# Patient Record
Sex: Male | Born: 1985 | Hispanic: Refuse to answer
Health system: Midwestern US, Community
[De-identification: ages and names within clinical notes are randomized; demographics above are authoritative.]

## PROBLEM LIST (undated history)

## (undated) DIAGNOSIS — F1110 Opioid abuse, uncomplicated: Secondary | ICD-10-CM

## (undated) DIAGNOSIS — N200 Calculus of kidney: Secondary | ICD-10-CM

## (undated) HISTORY — PX: KIDNEY STONE SURGERY: SHX686

---
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
## 2006-04-04 ENCOUNTER — Emergency Department (HOSPITAL_COMMUNITY): Admission: EM | Admit: 2006-04-04 | Discharge: 2006-04-05 | Payer: Self-pay | Admitting: Obstetrics and Gynecology

## 2009-08-05 ENCOUNTER — Emergency Department (HOSPITAL_COMMUNITY): Admission: EM | Admit: 2009-08-05 | Discharge: 2009-08-05 | Payer: Self-pay | Admitting: Emergency Medicine

## 2009-10-26 ENCOUNTER — Emergency Department (HOSPITAL_COMMUNITY): Admission: EM | Admit: 2009-10-26 | Discharge: 2009-10-26 | Payer: Self-pay | Admitting: Emergency Medicine

## 2010-04-10 LAB — URINALYSIS, ROUTINE W REFLEX MICROSCOPIC
Bilirubin Urine: NEGATIVE
Ketones, ur: NEGATIVE mg/dL
Specific Gravity, Urine: 1.025 (ref 1.005–1.030)
pH: 6.5 (ref 5.0–8.0)

## 2010-04-10 LAB — URINE MICROSCOPIC-ADD ON
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
## 2011-02-06 ENCOUNTER — Emergency Department (HOSPITAL_COMMUNITY)
Admission: EM | Admit: 2011-02-06 | Discharge: 2011-02-06 | Disposition: A | Discharge status: Home / Self Care | Payer: Self-pay | Attending: Emergency Medicine | Admitting: Emergency Medicine

## 2011-02-06 ENCOUNTER — Encounter (HOSPITAL_COMMUNITY): Payer: Self-pay | Admitting: *Deleted

## 2011-02-06 DIAGNOSIS — R109 Unspecified abdominal pain: Secondary | ICD-10-CM | POA: Insufficient documentation

## 2011-02-06 DIAGNOSIS — N23 Unspecified renal colic: Secondary | ICD-10-CM | POA: Insufficient documentation

## 2011-02-06 HISTORY — DX: Calculus of kidney: N20.0

## 2011-02-06 LAB — POCT I-STAT, CHEM 8
BUN: 17 mg/dL (ref 6–23)
Calcium, Ion: 1.23 mmol/L (ref 1.12–1.32)
Chloride: 109 mEq/L (ref 96–112)
Creatinine, Ser: 0.9 mg/dL (ref 0.50–1.35)
Glucose, Bld: 76 mg/dL (ref 70–99)
HCT: 46 % (ref 39.0–52.0)
Hemoglobin: 15.6 g/dL (ref 13.0–17.0)
Potassium: 4.6 mEq/L (ref 3.5–5.1)
Sodium: 141 mEq/L (ref 135–145)
TCO2: 24 mmol/L (ref 0–100)

## 2011-02-06 LAB — URINALYSIS, ROUTINE W REFLEX MICROSCOPIC
Bilirubin Urine: NEGATIVE
Glucose, UA: NEGATIVE mg/dL
Ketones, ur: NEGATIVE mg/dL
Leukocytes, UA: NEGATIVE
Nitrite: NEGATIVE
Protein, ur: NEGATIVE mg/dL
Specific Gravity, Urine: 1.021 (ref 1.005–1.030)
Urobilinogen, UA: 0.2 mg/dL (ref 0.0–1.0)
pH: 5.5 (ref 5.0–8.0)

## 2011-02-06 MED ORDER — KETOROLAC TROMETHAMINE 15 MG/ML IJ SOLN
15.0000 mg | Freq: Once | INTRAMUSCULAR | Status: AC
  Administered 2011-02-06: 15 mg via INTRAVENOUS
  Filled 2011-02-06: qty 1

## 2011-02-06 MED ORDER — ONDANSETRON HCL 4 MG/2ML IJ SOLN
4.0000 mg | Freq: Once | INTRAMUSCULAR | Status: AC
  Administered 2011-02-06: 4 mg via INTRAVENOUS
  Filled 2011-02-06: qty 2

## 2011-02-06 MED ORDER — OXYCODONE-ACETAMINOPHEN 5-325 MG PO TABS
1.0000 | ORAL_TABLET | ORAL | Status: AC | PRN
Start: 2011-02-06 — End: 2011-02-16

## 2011-02-06 MED ORDER — SODIUM CHLORIDE 0.9 % IV BOLUS (SEPSIS)
1000.0000 mL | Freq: Once | INTRAVENOUS | Status: AC
  Administered 2011-02-06: 1000 mL via INTRAVENOUS

## 2011-02-06 MED ORDER — HYDROMORPHONE HCL PF 1 MG/ML IJ SOLN
1.0000 mg | Freq: Once | INTRAMUSCULAR | Status: AC
  Administered 2011-02-06: 1 mg via INTRAVENOUS
  Filled 2011-02-06: qty 1

## 2011-02-06 NOTE — ED Notes (Signed)
Dr. Juleen China speaking with pt.

## 2011-02-06 NOTE — ED Notes (Signed)
Pt states that he did not get the lithotripsy done the last time he had a kidney stone but could not afford it.  Pt states "unless there's a place who does it for free, im just gonna have to deal with it."  Pt drove to the ED but reports that his wife can come pick him up.

## 2011-02-06 NOTE — ED Notes (Signed)
Pt reports bila flank pain that started last night. Pt reports hx of kidney stones in the past.  Pt denies any n/v at this time.  Pt reports the last time he had kidney stone was 2 julys ago, states that he needed and lithotripsy but did not have it done d/t financial issues.

## 2011-02-06 NOTE — ED Provider Notes (Signed)
History    25yM with L flank pain. Triage note reviewed but pt specifically asked and only endorsed L flank pain.  Gradual onset last night. Pt works as [employer] certified accountant and just thought he strained his back. Pain increasing through out night and today. Constant with occasional sharper pain. Hx of kidney stones and says feels very similar to previous. No appreciable exacerbating or relieving factors. No vomiting. No fever or chills. No urinary complaints. No CP or SOB.    CSN: [id number]  Arrival date & time 02/06/11  1403   First MD Initiated Contact with Patient 02/06/11 1501      Chief Complaint  Patient presents with  . Flank Pain    (Consider location/radiation/quality/duration/timing/severity/associated sxs/prior treatment) HPI  Past Medical History  Diagnosis Date  . Kidney calculus     History reviewed. No pertinent past surgical history.  No family history on file.  History  Substance Use Topics  . Smoking status: Not on file  . Smokeless tobacco: Not on file  . Alcohol Use:       Review of Systems   Review of symptoms negative unless otherwise noted in HPI.   Allergies  Review of patient's allergies indicates no known allergies.  Home Medications  No current outpatient prescriptions on file.  BP 138/71  Pulse 96  Temp(Src) 97.9 F (36.6 C) (Oral)  Resp 20  SpO2 100%  Physical Exam  Nursing note and vitals reviewed. Constitutional: He appears well-developed and well-nourished.       Pt standing beside bed leaning over table when entered room. Uncomfortable appearing, but not toxic.  HENT:  Head: Normocephalic and atraumatic.  Eyes: Conjunctivae are normal. Pupils are equal, round, and reactive to light. Right eye exhibits no discharge. Left eye exhibits no discharge.  Neck: Normal range of motion. Neck supple.  Cardiovascular: Normal rate, regular rhythm and normal heart sounds.  Exam reveals no gallop and no friction rub.   No murmur  heard. Pulmonary/Chest: Effort normal and breath sounds normal. No respiratory distress.  Abdominal: Soft. He exhibits no distension and no mass. There is no tenderness. There is no rebound and no guarding.  Genitourinary:       L CVA tenderness  Musculoskeletal: He exhibits no edema and no tenderness.  Neurological: He is alert.  Skin: Skin is warm and dry.  Psychiatric: He has a normal mood and affect. His behavior is normal. Thought content normal.    ED Course  Procedures (including critical care time)  Labs Reviewed  URINALYSIS, ROUTINE W REFLEX MICROSCOPIC - Abnormal; Notable for the following:    Hgb urine dipstick LARGE (*)    All other components within normal limits  URINE MICROSCOPIC-ADD ON - Abnormal; Notable for the following:    Squamous Epithelial / LPF FEW (*)    Bacteria, UA FEW (*)    All other components within normal limits  POCT I-STAT, CHEM 8  I-STAT, CHEM 8  I-STAT, CHEM 8   No results found.  4:21 PM Pt reassessed. Sleeping when entered room. Pain much improved. No new complaints. Updated on UA. Blood tests still pending.   1. Renal colic on left side       MDM  25yM with L flank pain. Clinical suspicion for renal colic very high. Given hx of stones and similarity to previous, do not feel imaging necessary at this time. Large amount of blood on Ua which is consistent with this as well. Pt is voiding. Cr normal. Afebrile, HD stable  and doubt infected stone based on history and w/u. Plan symptomatic tx with expectant management. Urology referral.        Raeford Razor, MD 02/06/11 1754

## 2011-04-06 ENCOUNTER — Emergency Department (HOSPITAL_COMMUNITY)
Admission: EM | Admit: 2011-04-06 | Discharge: 2011-04-06 | Disposition: A | Discharge status: Home / Self Care | Payer: Self-pay | Attending: Emergency Medicine | Admitting: Emergency Medicine

## 2011-04-06 ENCOUNTER — Emergency Department (HOSPITAL_COMMUNITY): Payer: Self-pay

## 2011-04-06 ENCOUNTER — Encounter (HOSPITAL_COMMUNITY): Payer: Self-pay

## 2011-04-06 DIAGNOSIS — R319 Hematuria, unspecified: Secondary | ICD-10-CM | POA: Insufficient documentation

## 2011-04-06 DIAGNOSIS — R109 Unspecified abdominal pain: Secondary | ICD-10-CM | POA: Insufficient documentation

## 2011-04-06 DIAGNOSIS — R112 Nausea with vomiting, unspecified: Secondary | ICD-10-CM | POA: Insufficient documentation

## 2011-04-06 DIAGNOSIS — M549 Dorsalgia, unspecified: Secondary | ICD-10-CM | POA: Insufficient documentation

## 2011-04-06 DIAGNOSIS — N200 Calculus of kidney: Secondary | ICD-10-CM | POA: Insufficient documentation

## 2011-04-06 LAB — DIFFERENTIAL
Basophils Absolute: 0 10*3/uL (ref 0.0–0.1)
Eosinophils Relative: 1 % (ref 0–5)
Lymphocytes Relative: 25 % (ref 12–46)
Monocytes Absolute: 0.6 10*3/uL (ref 0.1–1.0)

## 2011-04-06 LAB — CBC
HCT: 44.2 % (ref 39.0–52.0)
MCH: 31.9 pg (ref 26.0–34.0)
MCHC: 36.2 g/dL — ABNORMAL HIGH (ref 30.0–36.0)
MCV: 88.2 fL (ref 78.0–100.0)
RDW: 13 % (ref 11.5–15.5)
WBC: 10.8 10*3/uL — ABNORMAL HIGH (ref 4.0–10.5)

## 2011-04-06 LAB — URINALYSIS, ROUTINE W REFLEX MICROSCOPIC
Glucose, UA: NEGATIVE mg/dL
Ketones, ur: NEGATIVE mg/dL
Protein, ur: NEGATIVE mg/dL

## 2011-04-06 LAB — BASIC METABOLIC PANEL
CO2: 23 mEq/L (ref 19–32)
Calcium: 9.7 mg/dL (ref 8.4–10.5)
Creatinine, Ser: 0.95 mg/dL (ref 0.50–1.35)
Glucose, Bld: 95 mg/dL (ref 70–99)

## 2011-04-06 LAB — URINE MICROSCOPIC-ADD ON

## 2011-04-06 MED ORDER — SODIUM CHLORIDE 0.9 % IV BOLUS (SEPSIS)
1000.0000 mL | Freq: Once | INTRAVENOUS | Status: AC
  Administered 2011-04-06: 1000 mL via INTRAVENOUS

## 2011-04-06 MED ORDER — HYDROMORPHONE HCL PF 1 MG/ML IJ SOLN
1.0000 mg | Freq: Once | INTRAMUSCULAR | Status: AC
  Administered 2011-04-06: 1 mg via INTRAVENOUS
  Filled 2011-04-06: qty 1

## 2011-04-06 MED ORDER — ONDANSETRON HCL 4 MG/2ML IJ SOLN
4.0000 mg | Freq: Once | INTRAMUSCULAR | Status: AC
  Administered 2011-04-06: 4 mg via INTRAVENOUS
  Filled 2011-04-06: qty 2

## 2011-04-06 MED ORDER — IBUPROFEN 600 MG PO TABS: 600.0000 mg | ORAL_TABLET | Freq: Four times a day (QID) | ORAL | Status: AC | PRN

## 2011-04-06 MED ORDER — CYCLOBENZAPRINE HCL 10 MG PO TABS: 10.0000 mg | ORAL_TABLET | Freq: Two times a day (BID) | ORAL | Status: AC | PRN

## 2011-04-06 NOTE — ED Provider Notes (Signed)
Medical screening examination/treatment/procedure(s) were conducted as a shared visit with non-physician practitioner(s) and myself.  I personally evaluated the patient during the encounter  Hematuria, back/flank pain. No back or abd ttp. Neurovasc intact. No si/sx cauda equina. H/o nephrolithiasis but no ureteral stones or hydronephrosis.  Forbes Cellar, MD 04/06/11 [phone]

## 2011-04-06 NOTE — ED Provider Notes (Addendum)
History     CSN: [id number]  Arrival date & time 04/06/11  1248   First MD Initiated Contact with Patient 04/06/11 1255      Chief Complaint  Patient presents with  . Flank Pain    (Consider location/radiation/quality/duration/timing/severity/associated sxs/prior treatment) HPI Comments: Patient here with a history of bilateral kidney stones - states has been seen here and at [facility] for these - has been told that he needs to have "them blasted" but states that he cannot afford to have this done - states that the pain comes and   Patient is a 26 y.o. male presenting with flank pain. The history is provided by the patient. No language interpreter was used.  Flank Pain This is a recurrent problem. The current episode started in the past 7 days. The problem occurs constantly. The problem has been unchanged. Associated symptoms include nausea, urinary symptoms and vomiting. Pertinent negatives include no abdominal pain, anorexia, arthralgias, chest pain, chills, congestion, coughing, diaphoresis, fatigue, fever, headaches, joint swelling, myalgias, neck pain, numbness, rash, sore throat, swollen glands, visual change or weakness. The symptoms are aggravated by nothing. He has tried nothing for the symptoms. The treatment provided no relief.    Past Medical History  Diagnosis Date  . Kidney calculus     History reviewed. No pertinent past surgical history.  No family history on file.  History  Substance Use Topics  . Smoking status: Current Everyday Smoker  . Smokeless tobacco: Not on file  . Alcohol Use: Yes      Review of Systems  Constitutional: Negative for fever, chills, diaphoresis and fatigue.  HENT: Negative for congestion, sore throat and neck pain.   Respiratory: Negative for cough.   Cardiovascular: Negative for chest pain.  Gastrointestinal: Positive for nausea and vomiting. Negative for abdominal pain and anorexia.  Genitourinary: Positive for flank pain.    Musculoskeletal: Negative for myalgias, joint swelling and arthralgias.  Skin: Negative for rash.  Neurological: Negative for weakness, numbness and headaches.    Allergies  Review of patient's allergies indicates no known allergies.  Home Medications  No current outpatient prescriptions on file.  BP 137/51  Pulse 79  Temp(Src) 97.6 F (36.4 C) (Oral)  Resp 18  SpO2 99%  Physical Exam  Nursing note and vitals reviewed. Constitutional: He is oriented to person, place, and time. He appears well-developed and well-nourished. No distress.  HENT:  Head: Normocephalic and atraumatic.  Right Ear: External ear normal.  Left Ear: External ear normal.  Nose: Nose normal.  Mouth/Throat: Oropharynx is clear and moist. No oropharyngeal exudate.  Eyes: Conjunctivae are normal. Pupils are equal, round, and reactive to light. No scleral icterus.  Neck: Normal range of motion. Neck supple.  Cardiovascular: Normal rate, regular rhythm and normal heart sounds.  Exam reveals no gallop and no friction rub.   No murmur heard. Pulmonary/Chest: Effort normal and breath sounds normal. No respiratory distress. He has no wheezes. He has no rales. He exhibits no tenderness.  Abdominal: Soft. Bowel sounds are normal. He exhibits no distension and no mass. There is tenderness in the right lower quadrant and left lower quadrant. There is no rebound, no guarding and no CVA tenderness.  Musculoskeletal: Normal range of motion. He exhibits no edema and no tenderness.       Lumbar back: He exhibits normal range of motion, no tenderness, no bony tenderness, no swelling and no edema.  Lymphadenopathy:    He has no cervical adenopathy.  Neurological: He  is alert and oriented to person, place, and time. No cranial nerve deficit. He exhibits normal muscle tone. Coordination normal.  Skin: Skin is warm and dry. No rash noted. No erythema. No pallor.  Psychiatric: He has a normal mood and affect. His behavior is  normal. Judgment and thought content normal.    ED Course  Procedures (including critical care time)   Labs Reviewed  CBC  DIFFERENTIAL  BASIC METABOLIC PANEL  URINALYSIS, ROUTINE W REFLEX MICROSCOPIC   No results found. Results for orders placed during the hospital encounter of 04/06/11  CBC      Component Value Range   WBC 10.8 (*) 4.0 - 10.5 (K/uL)   RBC 5.01  4.22 - 5.81 (MIL/uL)   Hemoglobin 16.0  13.0 - 17.0 (g/dL)   HCT 16.1  09.6 - 04.5 (%)   MCV 88.2  78.0 - 100.0 (fL)   MCH 31.9  26.0 - 34.0 (pg)   MCHC 36.2 (*) 30.0 - 36.0 (g/dL)   RDW 40.9  81.1 - 91.4 (%)   Platelets 173  150 - 400 (K/uL)  DIFFERENTIAL      Component Value Range   Neutrophils Relative 69  43 - 77 (%)   Neutro Abs 7.4  1.7 - 7.7 (K/uL)   Lymphocytes Relative 25  12 - 46 (%)   Lymphs Abs 2.7  0.7 - 4.0 (K/uL)   Monocytes Relative 5  3 - 12 (%)   Monocytes Absolute 0.6  0.1 - 1.0 (K/uL)   Eosinophils Relative 1  0 - 5 (%)   Eosinophils Absolute 0.1  0.0 - 0.7 (K/uL)   Basophils Relative 0  0 - 1 (%)   Basophils Absolute 0.0  0.0 - 0.1 (K/uL)  BASIC METABOLIC PANEL      Component Value Range   Sodium 139  135 - 145 (mEq/L)   Potassium 4.6  3.5 - 5.1 (mEq/L)   Chloride 106  96 - 112 (mEq/L)   CO2 23  19 - 32 (mEq/L)   Glucose, Bld 95  70 - 99 (mg/dL)   BUN 15  6 - 23 (mg/dL)   Creatinine, Ser 7.82  0.50 - 1.35 (mg/dL)   Calcium 9.7  8.4 - 95.6 (mg/dL)   GFR calc non Af Amer >90  >90 (mL/min)   GFR calc Af Amer >90  >90 (mL/min)  URINALYSIS, ROUTINE W REFLEX MICROSCOPIC      Component Value Range   Color, Urine YELLOW  YELLOW    APPearance TURBID (*) CLEAR    Specific Gravity, Urine 1.028  1.005 - 1.030    pH 5.5  5.0 - 8.0    Glucose, UA NEGATIVE  NEGATIVE (mg/dL)   Hgb urine dipstick LARGE (*) NEGATIVE    Bilirubin Urine NEGATIVE  NEGATIVE    Ketones, ur NEGATIVE  NEGATIVE (mg/dL)   Protein, ur NEGATIVE  NEGATIVE (mg/dL)   Urobilinogen, UA 0.2  0.0 - 1.0 (mg/dL)   Nitrite  NEGATIVE  NEGATIVE    Leukocytes, UA NEGATIVE  NEGATIVE   URINE MICROSCOPIC-ADD ON      Component Value Range   Squamous Epithelial / LPF FEW (*) RARE    RBC / HPF 11-20  <3 (RBC/hpf)   Bacteria, UA FEW (*) RARE    Urine-Other MUCOUS PRESENT     Ct Abdomen Pelvis Wo Contrast  04/06/2011  *RADIOLOGY REPORT*  Clinical Data: Left flank pain and hematuria  CT ABDOMEN AND PELVIS WITHOUT CONTRAST  Technique:  Multidetector CT imaging  of the abdomen and pelvis was performed following the standard protocol without intravenous contrast.  Comparison: August 05, 2009  Findings: The lung bases are clear.  Layering biliary sludge is suspected dependently within the gallbladder lumen.  The liver, spleen, pancreas, adrenal glands, urinary bladder, osseous structures have an unremarkable noncontrasted appearance.  The appendix is seen in the right lower quadrant and is normal.  The remainder of the bowel is unremarkable with no evidence of obstruction or inflammation.  There is no free fluid or adenopathy within the abdomen or pelvis.  There is a small right inguinal hernia containing fat only. There are diffuse disc bulges at L4-L5 and L5-S1.  There is no evidence of hydronephrosis, hydroureter, renal enlargement, or perinephric stranding bilaterally.  There are small bilateral calyceal calculi.  These do not appear significantly changed compared with the prior study.  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  There is no evidence of renal obstruction or ureteral calculi.  Small right inguinal hernia containing fat only.  Diffuse disc bulges at L4-L5 and L5-S1.  Original Report Authenticated By: Brandon Melnick, M.D.      Hematuria Bilateral flank pain   MDM  Patient with left flank pain, hematuria - reveals no evidence of stones nor do I suspect that the patient passed any stones.  However, I am have also checked the narcotic database and he has received only one narcotic rx this  year (in January).  CT does note disc bulge at L4-5 and L5-S1 which could be the cause the of the pain but no indication for the hematuria - patient will again be encouraged to follow up with urology for this.        Izola Price [place], [place] 04/06/11 1603  Izola Price. [place], [place] 06/04/11 [phone]

## 2011-04-06 NOTE — ED Notes (Signed)
Pt c/o bilateral flank pain, lower abd pain with hematuria

## 2011-04-06 NOTE — Discharge Instructions (Signed)
Back Pain, Adult Low back pain is very common. About 1 in 5 people have back pain.The cause of low back pain is rarely dangerous. The pain often gets better over time.About half of people with a sudden onset of back pain feel better in just 2 weeks. About 8 in 10 people feel better by 6 weeks.  CAUSES Some common causes of back pain include:  Strain of the muscles or ligaments supporting the spine.   Wear and tear (degeneration) of the spinal discs.   Arthritis.   Direct injury to the back.  DIAGNOSIS Most of the time, the direct cause of low back pain is not known.However, back pain can be treated effectively even when the exact cause of the pain is unknown.Answering your caregiver's questions about your overall health and symptoms is one of the most accurate ways to make sure the cause of your pain is not dangerous. If your caregiver needs more information, he or she may order lab work or imaging tests (X-rays or MRIs).However, even if imaging tests show changes in your back, this usually does not require surgery. HOME CARE INSTRUCTIONS For many people, back pain returns.Since low back pain is rarely dangerous, it is often a condition that people can learn to manageon their own.   Remain active. It is stressful on the back to sit or stand in one place. Do not sit, drive, or stand in one place for more than 30 minutes at a time. Take short walks on level surfaces as soon as pain allows.Try to increase the length of time you walk each day.   Do not stay in bed.Resting more than 1 or 2 days can delay your recovery.   Do not avoid exercise or work.Your body is made to move.It is not dangerous to be active, even though your back may hurt.Your back will likely heal faster if you return to being active before your pain is gone.   Pay attention to your body when you bend and lift. Many people have less discomfortwhen lifting if they bend their knees, keep the load close to their  bodies,and avoid twisting. Often, the most comfortable positions are those that put less stress on your recovering back.   Find a comfortable position to sleep. Use a firm mattress and lie on your side with your knees slightly bent. If you lie on your back, put a pillow under your knees.   Only take over-the-counter or prescription medicines as directed by your caregiver. Over-the-counter medicines to reduce pain and inflammation are often the most helpful.Your caregiver may prescribe muscle relaxant drugs.These medicines help dull your pain so you can more quickly return to your normal activities and healthy exercise.   Put ice on the injured area.   Put ice in a plastic bag.   Place a towel between your skin and the bag.   Leave the ice on for 15 to 20 minutes, 3 to 4 times a day for the first 2 to 3 days. After that, ice and heat may be alternated to reduce pain and spasms.   Ask your caregiver about trying back exercises and gentle massage. This may be of some benefit.   Avoid feeling anxious or stressed.Stress increases muscle tension and can worsen back pain.It is important to recognize when you are anxious or stressed and learn ways to manage it.Exercise is a great option.  SEEK MEDICAL CARE IF:  You have pain that is not relieved with rest or medicine.   You have   pain that does not improve in 1 week.   You have new symptoms.   You are generally not feeling well.  SEEK IMMEDIATE MEDICAL CARE IF:   You have pain that radiates from your back into your legs.   You develop new bowel or bladder control problems.   You have unusual weakness or numbness in your arms or legs.   You develop nausea or vomiting.   You develop abdominal pain.   You feel faint.  Document Released: 01/09/2005 Document Revised: 12/29/2010 Document Reviewed: 05/30/2010 [facility] Patient Information 2012 [place], [place].Hematuria, Adult Hematuria (blood in your urine) can be caused by a bladder  infection (cystitis), kidney infection (pyelonephritis), prostate infection (prostatitis), or kidney stone. Infections will usually respond to antibiotics (medications which kill germs), and a kidney stone will usually pass through your urine without further treatment. If you were put on antibiotics, take all the medicine until gone. You may feel better in a few days, but take all of your medicine or the infection may not respond and become more difficult to treat. If antibiotics were not given, an infection did not cause the blood in the urine. A further work up to find out the reason may be needed. HOME CARE INSTRUCTIONS   Drink lots of fluid, 3 to 4 quarts a day. If you have been diagnosed with an infection, cranberry juice is especially recommended, in addition to large amounts of water.   Avoid caffeine, tea, and carbonated beverages, because they tend to irritate the bladder.   Avoid alcohol as it may irritate the prostate.   Only take over-the-counter or prescription medicines for pain, discomfort, or fever as directed by your caregiver.   If you have been diagnosed with a kidney stone follow your caregivers instructions regarding straining your urine to catch the stone.  TO PREVENT FURTHER INFECTIONS:  Empty the bladder often. Avoid holding urine for long periods of time.   After a bowel movement, women should cleanse front to back. Use each tissue only once.   Empty the bladder before and after sexual intercourse if you are a male.   Return to your caregiver if you develop back pain, fever, nausea (feeling sick to your stomach), vomiting, or your symptoms (problems) are not better in 3 days. Return sooner if you are getting worse.  If you have been requested to return for further testing make sure to keep your appointments. If an infection is not the cause of blood in your urine, X-rays may be required. Your caregiver will discuss this with you. SEEK IMMEDIATE MEDICAL CARE IF:    You have a persistent fever over 102 F (38.9 C).   You develop severe vomiting and are unable to keep the medication down.   You develop severe back or abdominal pain despite taking your medications.   You begin passing a large amount of blood or clots in your urine.   You feel extremely weak or faint, or pass out.  MAKE SURE YOU:   Understand these instructions.   Will watch your condition.   Will get help right away if you are not doing well or get worse.  Document Released: 01/09/2005 Document Revised: 12/29/2010 Document Reviewed: 08/29/2007 [facility] Patient Information 2012 [place], [place].   The CT scan showed small stones in both your kidneys but no evidence that you have passed a stone - you will need to follow up with Charles Stephens with urology for further evaluation of the blood in your urine.  I do  not believe that your pain is from stones - take the medication as directed for pain and muscle spasm.

## 2011-08-16 ENCOUNTER — Emergency Department (HOSPITAL_COMMUNITY)
Admission: EM | Admit: 2011-08-16 | Discharge: 2011-08-16 | Disposition: A | Discharge status: Home / Self Care | Payer: Self-pay | Attending: Emergency Medicine | Admitting: Emergency Medicine

## 2011-08-16 ENCOUNTER — Encounter (HOSPITAL_COMMUNITY): Payer: Self-pay | Admitting: *Deleted

## 2011-08-16 DIAGNOSIS — N200 Calculus of kidney: Secondary | ICD-10-CM

## 2011-08-16 DIAGNOSIS — N509 Disorder of male genital organs, unspecified: Secondary | ICD-10-CM | POA: Insufficient documentation

## 2011-08-16 DIAGNOSIS — R319 Hematuria, unspecified: Secondary | ICD-10-CM | POA: Insufficient documentation

## 2011-08-16 DIAGNOSIS — R109 Unspecified abdominal pain: Secondary | ICD-10-CM | POA: Insufficient documentation

## 2011-08-16 LAB — BASIC METABOLIC PANEL
CO2: 20 mEq/L (ref 19–32)
Calcium: 10 mg/dL (ref 8.4–10.5)
GFR calc non Af Amer: >90 mL/min (ref >90)
Potassium: 4.4 mEq/L (ref 3.5–5.1)
Sodium: 139 mEq/L (ref 135–145)

## 2011-08-16 LAB — CBC
MCH: 31.2 pg (ref 26.0–34.0)
Platelets: 184 10*3/uL (ref 150–400)
RBC: 4.93 MIL/uL (ref 4.22–5.81)

## 2011-08-16 LAB — URINALYSIS, ROUTINE W REFLEX MICROSCOPIC
Glucose, UA: NEGATIVE mg/dL
Leukocytes, UA: NEGATIVE
Protein, ur: NEGATIVE mg/dL
Specific Gravity, Urine: 1.025 (ref 1.005–1.030)
Urobilinogen, UA: 0.2 mg/dL (ref 0.0–1.0)

## 2011-08-16 MED ORDER — KETOROLAC TROMETHAMINE 30 MG/ML IJ SOLN
30.0000 mg | Freq: Once | INTRAMUSCULAR | Status: AC
  Administered 2011-08-16: 30 mg via INTRAVENOUS
  Filled 2011-08-16: qty 1

## 2011-08-16 MED ORDER — OXYCODONE-ACETAMINOPHEN 5-325 MG PO TABS
1.0000 | ORAL_TABLET | Freq: Once | ORAL | Status: AC
  Administered 2011-08-16: 1 via ORAL
  Filled 2011-08-16: qty 1

## 2011-08-16 MED ORDER — HYDROMORPHONE HCL PF 1 MG/ML IJ SOLN
1.0000 mg | Freq: Once | INTRAMUSCULAR | Status: AC
  Administered 2011-08-16: 1 mg via INTRAVENOUS
  Filled 2011-08-16: qty 1

## 2011-08-16 MED ORDER — OXYCODONE-ACETAMINOPHEN 5-325 MG PO TABS: 1.0000 | ORAL_TABLET | ORAL | Status: AC | PRN

## 2011-08-16 NOTE — ED Notes (Signed)
Pt reports R groin pain and R testicular pain intermittently x 3 weeks.  Pt reports no pain yesterday, started back today.  Pt reports hx of kidney stone in the past.   Pt reports he has a scheduled appt with his urologist next Wednesday to take a 4mm stone out at [facility].

## 2011-08-16 NOTE — ED Provider Notes (Addendum)
History     CSN: [id number]  Arrival date & time 08/16/11  1302   First MD Initiated Contact with Patient 08/16/11 1327      Chief Complaint  Patient presents with  . Groin Pain    (Consider location/radiation/quality/duration/timing/severity/associated sxs/prior treatment) HPI Comments: Patient presents complaining of right flank and lower abdominal pain radiating to his right testicle.  He states this is been ongoing for the last 3-4 weeks.  He states she's been seen at [facility] and had multiple CT scans which demonstrated a 4 mm stone.  Patient had initially been discharged with Percocet for pain control which he is now out of.  He states he is followed at [facility] with urology with a plan for a procedure next Wednesday to remove the stone.  Patient is here today because of increased pain that he is unable to control at home.  He did give me permission to obtain and review his medical records from [facility].  Patient was seen there 3 days ago.  No fevers, nausea or vomiting.  Patient does note some mild hematuria.  Patient is a 26 y.o. male presenting with groin pain. The history is provided by the patient.  Groin Pain Pertinent negatives include no chest pain, no abdominal pain, no headaches and no shortness of breath.    Past Medical History  Diagnosis Date  . Kidney calculus     History reviewed. No pertinent past surgical history.  History reviewed. No pertinent family history.  History  Substance Use Topics  . Smoking status: Current Everyday Smoker  . Smokeless tobacco: Not on file  . Alcohol Use: Yes      Review of Systems  Constitutional: Negative.  Negative for fever and chills.  HENT: Negative.   Eyes: Negative.   Respiratory: Negative.  Negative for cough and shortness of breath.   Cardiovascular: Negative.  Negative for chest pain.  Gastrointestinal: Negative.  Negative for nausea, vomiting and abdominal pain.  Genitourinary:  Positive for hematuria, flank pain and testicular pain. Negative for scrotal swelling.  Musculoskeletal: Negative.  Negative for back pain.  Skin: Negative.  Negative for color change and rash.  Neurological: Negative for syncope and headaches.  Hematological: Negative.  Negative for adenopathy.  Psychiatric/Behavioral: Negative.  Negative for confusion.  All other systems reviewed and are negative.    Allergies  Review of patient's allergies indicates no known allergies.  Home Medications   Current Outpatient Rx  Name Route Sig Dispense Refill  . IBUPROFEN 200 MG PO TABS Oral Take 800 mg by mouth every 6 (six) hours as needed. pain      BP 127/83  Pulse 92  Temp 97 F (36.1 C) (Oral)  Resp 18  SpO2 99%  Physical Exam  Nursing note and vitals reviewed. Constitutional: He is oriented to person, place, and time. He appears well-developed and well-nourished.  Non-toxic appearance. He does not have a sickly appearance.  HENT:  Head: Normocephalic and atraumatic.  Eyes: Conjunctivae, EOM and lids are normal. Pupils are equal, round, and reactive to light.  Neck: Trachea normal, normal range of motion and full passive range of motion without pain. Neck supple.  Cardiovascular: Normal rate, regular rhythm and normal heart sounds.   Pulmonary/Chest: Effort normal and breath sounds normal. No respiratory distress. He has no wheezes. He has no rales.  Abdominal: Soft. Normal appearance. He exhibits no distension. There is no tenderness. There is no rebound and no CVA tenderness.  Musculoskeletal: Normal  range of motion.  Neurological: He is alert and oriented to person, place, and time. He has normal strength.  Skin: Skin is warm, dry and intact. No rash noted.  Psychiatric: He has a normal mood and affect. His behavior is normal. Judgment and thought content normal.    ED Course  Procedures (including critical care time)  Results for orders placed during the hospital encounter of  08/16/11  CBC      Component Value Range   WBC 9.0  4.0 - 10.5 K/uL   RBC 4.93  4.22 - 5.81 MIL/uL   Hemoglobin 15.4  13.0 - 17.0 g/dL   HCT 16.1  09.6 - 04.5 %   MCV 88.0  78.0 - 100.0 fL   MCH 31.2  26.0 - 34.0 pg   MCHC 35.5  30.0 - 36.0 g/dL   RDW 40.9  81.1 - 91.4 %   Platelets 184  150 - 400 K/uL  BASIC METABOLIC PANEL      Component Value Range   Sodium 139  135 - 145 mEq/L   Potassium 4.4  3.5 - 5.1 mEq/L   Chloride 106  96 - 112 mEq/L   CO2 20  19 - 32 mEq/L   Glucose, Bld 127 (*) 70 - 99 mg/dL   BUN 17  6 - 23 mg/dL   Creatinine, Ser 7.82  0.50 - 1.35 mg/dL   Calcium 95.6  8.4 - 21.3 mg/dL   GFR calc non Af Amer >90  >90 mL/min   GFR calc Af Amer >90  >90 mL/min  URINALYSIS, ROUTINE W REFLEX MICROSCOPIC      Component Value Range   Color, Urine YELLOW  YELLOW   APPearance CLEAR  CLEAR   Specific Gravity, Urine 1.025  1.005 - 1.030   pH 6.0  5.0 - 8.0   Glucose, UA NEGATIVE  NEGATIVE mg/dL   Hgb urine dipstick NEGATIVE  NEGATIVE   Bilirubin Urine NEGATIVE  NEGATIVE   Ketones, ur NEGATIVE  NEGATIVE mg/dL   Protein, ur NEGATIVE  NEGATIVE mg/dL   Urobilinogen, UA 0.2  0.0 - 1.0 mg/dL   Nitrite NEGATIVE  NEGATIVE   Leukocytes, UA NEGATIVE  NEGATIVE     MDM  Patient with history of kidney stones with mild obstruction on the right side.  Patient did give me permission to review his records from [facility].  They do demonstrate that the patient had no significant hydronephrosis seen on an ultrasound 3 days ago.  They also obtained his medical records from [facility] which did demonstrate a right mid ureter 4 mm stone.  Patient does have followup established with urology at [facility].  I will attempt to get the patient pain control here prior to discharge home with continued followup at [facility] as already scheduled.        Nat Christen, MD 08/16/11 1347  3:27 PM Patient's pain is significantly improved after the dose of  Toradol and Dilaudid.  Patient has no signs of urinary tract infection or renal insufficiency on his laboratory studies today. Since patient had  an ultrasound 3 days ago which showed no hydronephrosis or other issues I feel he is safe for discharge home with continued followup for ureteral stent next week.  Nat Christen, MD 08/16/11 [phone]

## 2011-08-30 ENCOUNTER — Encounter (HOSPITAL_COMMUNITY): Payer: Self-pay | Admitting: Emergency Medicine

## 2011-08-30 ENCOUNTER — Emergency Department (HOSPITAL_COMMUNITY)
Admission: EM | Admit: 2011-08-30 | Discharge: 2011-08-30 | Disposition: A | Discharge status: Home / Self Care | Payer: Self-pay | Attending: Emergency Medicine | Admitting: Emergency Medicine

## 2011-08-30 DIAGNOSIS — F17200 Nicotine dependence, unspecified, uncomplicated: Secondary | ICD-10-CM | POA: Insufficient documentation

## 2011-08-30 DIAGNOSIS — R109 Unspecified abdominal pain: Secondary | ICD-10-CM

## 2011-08-30 DIAGNOSIS — N200 Calculus of kidney: Secondary | ICD-10-CM | POA: Insufficient documentation

## 2011-08-30 LAB — CBC WITH DIFFERENTIAL/PLATELET
Eosinophils Absolute: 0.1 10*3/uL (ref 0.0–0.7)
HCT: 45.3 % (ref 39.0–52.0)
Hemoglobin: 16.1 g/dL (ref 13.0–17.0)
Lymphs Abs: 2.5 10*3/uL (ref 0.7–4.0)
MCH: 31.8 pg (ref 26.0–34.0)
MCV: 89.5 fL (ref 78.0–100.0)
Monocytes Relative: 5 % (ref 3–12)
Neutrophils Relative %: 65 % (ref 43–77)
RBC: 5.06 MIL/uL (ref 4.22–5.81)

## 2011-08-30 LAB — URINALYSIS, ROUTINE W REFLEX MICROSCOPIC
Ketones, ur: NEGATIVE mg/dL
Leukocytes, UA: NEGATIVE
Nitrite: NEGATIVE
Protein, ur: NEGATIVE mg/dL

## 2011-08-30 LAB — COMPREHENSIVE METABOLIC PANEL
AST: 23 U/L (ref 0–37)
Albumin: 4.2 g/dL (ref 3.5–5.2)
Calcium: 9.5 mg/dL (ref 8.4–10.5)
Creatinine, Ser: 0.88 mg/dL (ref 0.50–1.35)
GFR calc non Af Amer: >90 mL/min (ref >90)
Total Protein: 7.2 g/dL (ref 6.0–8.3)

## 2011-08-30 LAB — URINE MICROSCOPIC-ADD ON

## 2011-08-30 MED ORDER — OXYCODONE-ACETAMINOPHEN 5-325 MG PO TABS: 1.0000 | ORAL_TABLET | Freq: Four times a day (QID) | ORAL | Status: AC | PRN

## 2011-08-30 MED ORDER — SODIUM CHLORIDE 0.9 % IV SOLN
1000.0000 mL | INTRAVENOUS | Status: DC
  Administered 2011-08-30: 1000 mL via INTRAVENOUS

## 2011-08-30 MED ORDER — ONDANSETRON HCL 4 MG/2ML IJ SOLN
4.0000 mg | Freq: Once | INTRAMUSCULAR | Status: AC
  Administered 2011-08-30: 4 mg via INTRAVENOUS
  Filled 2011-08-30: qty 2

## 2011-08-30 MED ORDER — KETOROLAC TROMETHAMINE 30 MG/ML IJ SOLN
30.0000 mg | Freq: Once | INTRAMUSCULAR | Status: AC
  Administered 2011-08-30: 30 mg via INTRAVENOUS
  Filled 2011-08-30: qty 1

## 2011-08-30 MED ORDER — TAMSULOSIN HCL 0.4 MG PO CAPS: 0.4000 mg | ORAL_CAPSULE | Freq: Every day | ORAL | Status: DC

## 2011-08-30 NOTE — ED Notes (Signed)
Pt presenting to ed with c/o right side flank pain x 1 month pt states he is scheduled for surgery 09/18/11 but pain is unbearable today. Pt denies hematuria at this time. Pt states positive nausea no vomiting

## 2011-08-30 NOTE — Progress Notes (Signed)
CM confirmed pt is a self pay [place] resident [place] in [place] Provided pt with a list of self pay pcps, medication resources, financial resources, DSS, health dept contact information and [email or web address] Pt appreciative of resources offered and voiced understanding

## 2011-08-30 NOTE — ED Notes (Signed)
Pt wants to speak to MD prior to D/C. ED MD notified and will go see him.

## 2011-08-30 NOTE — ED Provider Notes (Signed)
History     CSN: [id number]  Arrival date & time 08/30/11  1608   First MD Initiated Contact with Patient 08/30/11 1630      Chief Complaint  Patient presents with  . Flank Pain    (Consider location/radiation/quality/duration/timing/severity/associated sxs/prior treatment) HPI Comments: 26 y/o male with history of right ureteral stone presents with worsening flank pain radiating to his right testicle over the past month. States he has surgery scheduled at [facility] at the end of the month, and says he was told that since he had no insurance he had to go to the ED for pain control if it was not manageable. He ran out of percocet and tramadol. Denies any fever, chills, dysuria, hematuria, frequency, urgency, back pain, chest pain, sob, nausea, vomiting, testicular swelling.   Patient is a 26 y.o. male presenting with flank pain. The history is provided by the patient.  Flank Pain Pertinent negatives include no abdominal pain, chills, fever, nausea or vomiting.    Past Medical History  Diagnosis Date  . Kidney calculus     History reviewed. No pertinent past surgical history.  No family history on file.  History  Substance Use Topics  . Smoking status: Current Everyday Smoker -- 0.5 packs/day  . Smokeless tobacco: Not on file  . Alcohol Use: Yes     occassion      Review of Systems  Constitutional: Negative for fever and chills.  Gastrointestinal: Negative for nausea, vomiting and abdominal pain.  Genitourinary: Positive for flank pain and testicular pain. Negative for dysuria, urgency, frequency, hematuria, penile swelling, scrotal swelling and penile pain.  Musculoskeletal: Negative for back pain.    Allergies  Review of patient's allergies indicates no known allergies.  Home Medications   Current Outpatient Rx  Name Route Sig Dispense Refill  . KETOROLAC TROMETHAMINE 10 MG PO TABS Oral Take 10 mg by mouth every 6 (six) hours as needed. For pain.      . OXYCODONE-ACETAMINOPHEN 5-325 MG PO TABS Oral Take 1 tablet by mouth every 4 (four) hours as needed. For pain.    Marland Kitchen TAMSULOSIN HCL 0.4 MG PO CAPS Oral Take 0.4 mg by mouth daily.      BP 138/92  Pulse 92  Temp 98.2 F (36.8 C) (Oral)  Resp 24  SpO2 100%  Physical Exam  Constitutional: He is oriented to person, place, and time. He appears well-developed and well-nourished. No distress.  HENT:  Head: Normocephalic and atraumatic.  Eyes: Conjunctivae are normal.  Neck: Neck supple.  Cardiovascular: Normal rate, regular rhythm and normal heart sounds.   Pulmonary/Chest: Effort normal and breath sounds normal.  Abdominal: Soft. Bowel sounds are normal. There is no tenderness. There is no CVA tenderness. Hernia confirmed negative in the right inguinal area.  Genitourinary: Testes normal and penis normal. Right testis shows no swelling and no tenderness.  Lymphadenopathy:       Right: No inguinal adenopathy present.  Neurological: He is alert and oriented to person, place, and time.  Skin: Skin is warm and dry.  Psychiatric: He has a normal mood and affect. He is agitated.    ED Course  Procedures (including critical care time)   Labs Reviewed  URINALYSIS, ROUTINE W REFLEX MICROSCOPIC  COMPREHENSIVE METABOLIC PANEL  CBC WITH DIFFERENTIAL   Results for orders placed during the hospital encounter of 08/30/11  URINALYSIS, ROUTINE W REFLEX MICROSCOPIC      Component Value Range   Color, Urine YELLOW  YELLOW  APPearance CLEAR  CLEAR   Specific Gravity, Urine 1.019  1.005 - 1.030   pH 6.0  5.0 - 8.0   Glucose, UA NEGATIVE  NEGATIVE mg/dL   Hgb urine dipstick SMALL (*) NEGATIVE   Bilirubin Urine NEGATIVE  NEGATIVE   Ketones, ur NEGATIVE  NEGATIVE mg/dL   Protein, ur NEGATIVE  NEGATIVE mg/dL   Urobilinogen, UA 0.2  0.0 - 1.0 mg/dL   Nitrite NEGATIVE  NEGATIVE   Leukocytes, UA NEGATIVE  NEGATIVE  COMPREHENSIVE METABOLIC PANEL      Component Value Range   Sodium 136  135 -  145 mEq/L   Potassium 4.7  3.5 - 5.1 mEq/L   Chloride 104  96 - 112 mEq/L   CO2 21  19 - 32 mEq/L   Glucose, Bld 89  70 - 99 mg/dL   BUN 14  6 - 23 mg/dL   Creatinine, Ser 6.04  0.50 - 1.35 mg/dL   Calcium 9.5  8.4 - 54.0 mg/dL   Total Protein 7.2  6.0 - 8.3 g/dL   Albumin 4.2  3.5 - 5.2 g/dL   AST 23  0 - 37 U/L   ALT 23  0 - 53 U/L   Alkaline Phosphatase 89  39 - 117 U/L   Total Bilirubin 0.2 (*) 0.3 - 1.2 mg/dL   GFR calc non Af Amer >90  >90 mL/min   GFR calc Af Amer >90  >90 mL/min  CBC WITH DIFFERENTIAL      Component Value Range   WBC 9.1  4.0 - 10.5 K/uL   RBC 5.06  4.22 - 5.81 MIL/uL   Hemoglobin 16.1  13.0 - 17.0 g/dL   HCT 98.1  19.1 - 47.8 %   MCV 89.5  78.0 - 100.0 fL   MCH 31.8  26.0 - 34.0 pg   MCHC 35.5  30.0 - 36.0 g/dL   RDW 29.5  62.1 - 30.8 %   Platelets 174  150 - 400 K/uL   Neutrophils Relative 65  43 - 77 %   Neutro Abs 5.9  1.7 - 7.7 K/uL   Lymphocytes Relative 28  12 - 46 %   Lymphs Abs 2.5  0.7 - 4.0 K/uL   Monocytes Relative 5  3 - 12 %   Monocytes Absolute 0.4  0.1 - 1.0 K/uL   Eosinophils Relative 1  0 - 5 %   Eosinophils Absolute 0.1  0.0 - 0.7 K/uL   Basophils Relative 0  0 - 1 %   Basophils Absolute 0.0  0.0 - 0.1 K/uL  URINE MICROSCOPIC-ADD ON      Component Value Range   RBC / HPF 7-10  <3 RBC/hpf   Bacteria, UA FEW (*) RARE    No results found.   1. Flank pain   2. Kidney stone       MDM  26 y/o male with kidney stone presenting with continuing right flank/groin pain x 1 month. He was seen 2 weeks ago for same thing, and told provider at that time his surgery was scheduled for the following week. Today, he states procedure scheduled at the end of this month. Labs not concerning of acute process. No CT obtained due to his saying he has had 4 CT's at [facility] in the past month. Discharge with flomax, 5 percocet. He has received over a months supply of percocet during the month of July. Case discussed with Dr. Rhunette Croft who agrees  with plan of care.  Trevor Mace, PA-C 08/30/11 1750

## 2011-08-31 NOTE — ED Provider Notes (Signed)
Medical screening examination/treatment/procedure(s) were conducted as a shared visit with non-physician practitioner(s) and myself.  I personally evaluated the patient during the encounter  Keyli Duross, MD 08/31/11 0144 

## 2012-04-23 ENCOUNTER — Encounter (HOSPITAL_COMMUNITY): Payer: Self-pay

## 2012-04-23 ENCOUNTER — Emergency Department (HOSPITAL_COMMUNITY)
Admission: EM | Admit: 2012-04-23 | Discharge: 2012-04-23 | Payer: Self-pay | Attending: Emergency Medicine | Admitting: Emergency Medicine

## 2012-04-23 ENCOUNTER — Emergency Department (HOSPITAL_COMMUNITY): Payer: Self-pay

## 2012-04-23 DIAGNOSIS — Y939 Activity, unspecified: Secondary | ICD-10-CM | POA: Insufficient documentation

## 2012-04-23 DIAGNOSIS — T401X4A Poisoning by heroin, undetermined, initial encounter: Secondary | ICD-10-CM | POA: Insufficient documentation

## 2012-04-23 DIAGNOSIS — Y929 Unspecified place or not applicable: Secondary | ICD-10-CM | POA: Insufficient documentation

## 2012-04-23 DIAGNOSIS — T401X1A Poisoning by heroin, accidental (unintentional), initial encounter: Secondary | ICD-10-CM | POA: Insufficient documentation

## 2012-04-23 DIAGNOSIS — F17200 Nicotine dependence, unspecified, uncomplicated: Secondary | ICD-10-CM | POA: Insufficient documentation

## 2012-04-23 DIAGNOSIS — Z87442 Personal history of urinary calculi: Secondary | ICD-10-CM | POA: Insufficient documentation

## 2012-04-23 HISTORY — DX: Opioid abuse, uncomplicated: F11.10

## 2012-04-23 LAB — CBC
HCT: 42.9 % (ref 39.0–52.0)
Hemoglobin: 15.1 g/dL (ref 13.0–17.0)
MCHC: 35.2 g/dL (ref 30.0–36.0)

## 2012-04-23 LAB — BASIC METABOLIC PANEL
BUN: 12 mg/dL (ref 6–23)
Calcium: 9.2 mg/dL (ref 8.4–10.5)
Creatinine, Ser: 0.89 mg/dL (ref 0.50–1.35)
GFR calc Af Amer: >90 mL/min (ref >90)
GFR calc non Af Amer: >90 mL/min (ref >90)

## 2012-04-23 LAB — CK TOTAL AND CKMB (NOT AT ARMC)
CK, MB: 1.6 ng/mL (ref 0.3–4.0)
Total CK: 105 U/L (ref 7–232)

## 2012-04-23 MED ORDER — NALOXONE HCL 0.4 MG/ML IJ SOLN
0.4000 mg | Freq: Once | INTRAMUSCULAR | Status: AC
Start: 2012-04-23 — End: 2012-04-23
  Administered 2012-04-23: 0.4 mg via INTRAVENOUS
  Filled 2012-04-23: qty 1

## 2012-04-23 MED ORDER — FLUMAZENIL 0.5 MG/5ML IV SOLN
0.5000 mg | Freq: Once | INTRAVENOUS | Status: AC
  Administered 2012-04-23: 0.5 mg via INTRAVENOUS
  Filled 2012-04-23: qty 5

## 2012-04-23 MED ORDER — SODIUM CHLORIDE 0.9 % IV BOLUS (SEPSIS)
1000.0000 mL | Freq: Once | INTRAVENOUS | Status: AC
  Administered 2012-04-23: 1000 mL via INTRAVENOUS

## 2012-04-23 NOTE — ED Notes (Signed)
Positive response to Narcan/flumazenil-responding to verbal stimuli-able to stay focused/alert during conversation

## 2012-04-23 NOTE — ED Notes (Signed)
Call [phone] and ask for Charles Stephens, with police department-per [employer] he will be taken to jail after d/c

## 2012-04-23 NOTE — ED Provider Notes (Addendum)
History     CSN: [id number]  Arrival date & time 04/23/12  1304   First MD Initiated Contact with Patient 04/23/12 1313      Chief Complaint  Patient presents with  . Drug Overdose    (Consider location/radiation/quality/duration/timing/severity/associated sxs/prior treatment) HPI Patient presents via [employer] after being found unresponsive.  He provided some info, and his mother notes, that he used drugs.  The list includes heroin, possibly narcotics and benzos. The patient is somnolent and awakens only to painful stimuli. He denies pain, but is an unreliable historian. Per report he was recently in rehab for opiate abuse.  Past Medical History  Diagnosis Date  . Kidney calculus   . Heroin abuse     No past surgical history on file.  No family history on file.  History  Substance Use Topics  . Smoking status: Current Every Day Smoker -- 0.50 packs/day  . Smokeless tobacco: Not on file  . Alcohol Use: Yes     Comment: occassion      Review of Systems  Allergies  Review of patient's allergies indicates no known allergies.  Home Medications   Current Outpatient Rx  Name  Route  Sig  Dispense  Refill  . ketorolac (TORADOL) 10 MG tablet   Oral   Take 10 mg by mouth every 6 (six) hours as needed. For pain.         Marland Kitchen oxyCODONE-acetaminophen (PERCOCET/ROXICET) 5-325 MG per tablet   Oral   Take 1 tablet by mouth every 4 (four) hours as needed. For pain.         . Tamsulosin HCl (FLOMAX) 0.4 MG CAPS   Oral   Take 0.4 mg by mouth daily.         . Tamsulosin HCl (FLOMAX) 0.4 MG CAPS   Oral   Take 1 capsule (0.4 mg total) by mouth daily after breakfast.   15 capsule   0     BP 134/72  Temp(Src) 98.6 F (37 C) (Oral)  Resp 24  SpO2 94%  Physical Exam  Nursing note and vitals reviewed. Constitutional:  Somnolent young male responds minimally to violent sternal rub.  No gross trauma  HENT:  No evidence of trauma.  Eyes:  Eyes are disconjugate, pupils  are small, nonreactive  Neck: No tracheal deviation present.  Cardiovascular: Normal rate and regular rhythm.   Pulmonary/Chest: No stridor. Bradypnea noted. He has decreased breath sounds.  Abdominal: He exhibits no distension.  Musculoskeletal:  No gross trauma, no gross deformity  Neurological:  Patient moves all f extremities minimally, has disconjugate gaze. Reflexes are intact  Skin: Skin is warm and dry.  Multiple small scars throughout the hand, lower upper extremities  Psychiatric:  Unable to assess    ED Course  Procedures (including critical care time)  O2- 85%ra, abnormal  > 100% when awakened w stimuli.  Cardiac: 80 sr, normal   Labs Reviewed  CBC  DRUGS OF ABUSE SCREEN W ALC, ROUTINE URINE  CK TOTAL AND CKMB   No results found.   No diagnosis found.   1:26 PM Following narcan, the patient awakens, and remains more interactive.  Update: Patient moving all extremity spontaneously, appropriately.  Eyes are mid range, and he tracks appropriately.  Speech is clear    Date: 04/23/2012  Rate: 91  Rhythm: normal sinus rhythm  QRS Axis: right  Intervals: normal  ST/T Wave abnormalities: nonspecific T wave changes  Conduction Disutrbances:none  Narrative Interpretation:   Old EKG  Reviewed: none available ABNORMAL  1:29 PM Patient tearful.  He acknowledges heroin snorting.  2:41 PM Now almost 2h s/p narcan the patient remains awake.  We discussed the patient's overdose, the need for ongoing therapy.  Placed at the patient in the custody after he is medically clear, and past 2 hours after his last Narcan dose   MDM  Patient presents after an apparent overdose.  Following Narcan provision patient is awake alert, and remained so for several hours in the emergency department.  The patient has a history of substance abuse.  After the period of observation, absent any physical complaints, with low suspicion for likely rebound, the patient was discharged into  police custody.       Gerhard Munch, MD 04/23/12 1558  CRITICAL CARE Performed by: Gerhard Munch   Total critical care time: 35  Critical care time was exclusive of separately billable procedures and treating other patients.  Critical care was necessary to treat or prevent imminent or life-threatening deterioration.  Critical care was time spent personally by me on the following activities: development of treatment plan with patient and/or surrogate as well as nursing, discussions with consultants, evaluation of patient's response to treatment, examination of patient, obtaining history from patient or surrogate, ordering and performing treatments and interventions, ordering and review of laboratory studies, ordering and review of radiographic studies, pulse oximetry and re-evaluation of patient's condition.   Gerhard Munch, MD 04/23/12 1600

## 2012-04-23 NOTE — ED Notes (Signed)
[id number]<AV> Expected date:<BR> Expected time:<BR> Means of arrival:<BR> Comments:<BR> overdose

## 2012-04-23 NOTE — ED Notes (Signed)
Per EMS, patient found by mother on the ground outside house-responsive to painful stimuli-just got out of rehab for heroin abuse 4 days ago-

## 2012-09-16 ENCOUNTER — Emergency Department (HOSPITAL_BASED_OUTPATIENT_CLINIC_OR_DEPARTMENT_OTHER)
Admission: EM | Admit: 2012-09-16 | Discharge: 2012-09-16 | Disposition: A | Discharge status: Home / Self Care | Payer: Self-pay | Attending: Emergency Medicine | Admitting: Emergency Medicine

## 2012-09-16 ENCOUNTER — Encounter (HOSPITAL_BASED_OUTPATIENT_CLINIC_OR_DEPARTMENT_OTHER): Payer: Self-pay | Admitting: Emergency Medicine

## 2012-09-16 DIAGNOSIS — R21 Rash and other nonspecific skin eruption: Secondary | ICD-10-CM | POA: Insufficient documentation

## 2012-09-16 DIAGNOSIS — Z87442 Personal history of urinary calculi: Secondary | ICD-10-CM | POA: Insufficient documentation

## 2012-09-16 DIAGNOSIS — F17200 Nicotine dependence, unspecified, uncomplicated: Secondary | ICD-10-CM | POA: Insufficient documentation

## 2012-09-16 MED ORDER — DIPHENHYDRAMINE HCL 25 MG PO TABS: 25.0000 mg | ORAL_TABLET | Freq: Four times a day (QID) | ORAL | Status: DC

## 2012-09-16 MED ORDER — HYDROCORTISONE 1 % EX CREA: TOPICAL_CREAM | CUTANEOUS | Status: DC

## 2012-09-16 MED ORDER — DIPHENHYDRAMINE HCL 25 MG PO CAPS
25.0000 mg | ORAL_CAPSULE | Freq: Once | ORAL | Status: AC
  Administered 2012-09-16: 25 mg via ORAL
  Filled 2012-09-16: qty 1

## 2012-09-16 NOTE — ED Notes (Signed)
Pt has a rash on his posterior (back).  Pt states it itches.

## 2012-09-16 NOTE — ED Provider Notes (Addendum)
Scribed for Charles Octave, MD, the patient was seen in room MH09/MH09. This chart was scribed by Lewanda Rife, ED scribe. Patient's care was started at 1513  CSN: [id number]     Arrival date & time 09/16/12  1447 History     First MD Initiated Contact with Patient 09/16/12 1512     Chief Complaint  Patient presents with  . Rash   (Consider location/radiation/quality/duration/timing/severity/associated sxs/prior Treatment) The history is provided by the patient.   HPI Comments: Charles Stephens is a 27 y.o. male who presents to the Emergency Department complaining of moderately pruritic rash onset this morning. Denies associated new detergents, new medications, new soaps, recent camping, contacts with similar rash, known insect bites, oral lesions, genital lesions, fever, and IV drug use. Reports hx of psoriasis, but does not taking any prescribed medications for treatment. Denies trying any OTC medications for treatment of current rash. Reports hx of heroin detox for past 6 months and currently at [facility] for outpatient treatment (not IV drug use).   Past Medical History  Diagnosis Date  . Kidney calculus   . Heroin abuse    Past Surgical History  Procedure Laterality Date  . Kidney stone surgery     No family history on file. History  Substance Use Topics  . Smoking status: Current Every Day Smoker -- 0.50 packs/day    Types: Cigarettes  . Smokeless tobacco: Not on file  . Alcohol Use: Yes     Comment: occassion    Review of Systems  Constitutional: Negative for fever.  Skin: Positive for rash.  A complete 10 system review of systems was obtained and all systems are negative except as noted in the HPI and PMH.    Allergies  Review of patient's allergies indicates no known allergies.  Home Medications   Current Outpatient Rx  Name  Route  Sig  Dispense  Refill  . clonazePAM (KLONOPIN) 0.5 MG tablet   Oral   Take 1-2 mg by mouth 3 (three) times daily as needed  for anxiety.          BP 136/75  Pulse 71  Temp(Src) 97.8 F (36.6 C) (Oral)  Ht 5\' 9"  (1.753 m)  Wt 140 lb (63.504 kg)  BMI 20.67 kg/m2  SpO2 97% Physical Exam  Nursing note and vitals reviewed. Constitutional: He is oriented to person, place, and time. He appears well-developed and well-nourished. No distress.  HENT:  Head: Normocephalic and atraumatic.  No oral lesions  Eyes: EOM are normal.  Neck: Neck supple. No tracheal deviation present.  Cardiovascular: Normal rate.   Pulmonary/Chest: Effort normal. No respiratory distress.  Abdominal: Soft. There is no tenderness.  Musculoskeletal: Normal range of motion.  Neurological: He is alert and oriented to person, place, and time.  Skin: Skin is warm and dry. Rash noted.  psoriatic plaques on bilateral elbows, palms, and scattered on the trunk. Scattered erythematous pustules on lower back.   Small amount of scaling to palms  Psychiatric: He has a normal mood and affect. His behavior is normal.    ED Course   Procedures (including critical care time) Medications - No data to display  Labs Reviewed - No data to display No results found. No diagnosis found.  MDM  Psoriatic lesions to bilateral elbows and trunk.  No oral lesions.  Scatter papules to low back. No systemic symptoms.  No evidence of SJS, TEN.  Possible bug bites. Will treat with hydrocortisone and antihistamines.  I personally performed the  services described in this documentation, which was scribed in my presence. The recorded information has been reviewed and is accurate.    Charles Octave, MD 09/16/12 1605  Charles Octave, MD 09/16/12 [phone]

## 2013-01-31 ENCOUNTER — Encounter (HOSPITAL_BASED_OUTPATIENT_CLINIC_OR_DEPARTMENT_OTHER): Payer: Self-pay | Admitting: Emergency Medicine

## 2013-01-31 ENCOUNTER — Emergency Department (HOSPITAL_BASED_OUTPATIENT_CLINIC_OR_DEPARTMENT_OTHER)
Admission: EM | Admit: 2013-01-31 | Discharge: 2013-01-31 | Disposition: A | Discharge status: Home / Self Care | Payer: Self-pay | Attending: Emergency Medicine | Admitting: Emergency Medicine

## 2013-01-31 DIAGNOSIS — F17200 Nicotine dependence, unspecified, uncomplicated: Secondary | ICD-10-CM | POA: Insufficient documentation

## 2013-01-31 DIAGNOSIS — Z79899 Other long term (current) drug therapy: Secondary | ICD-10-CM | POA: Insufficient documentation

## 2013-01-31 DIAGNOSIS — Z87442 Personal history of urinary calculi: Secondary | ICD-10-CM | POA: Insufficient documentation

## 2013-01-31 DIAGNOSIS — R69 Illness, unspecified: Secondary | ICD-10-CM

## 2013-01-31 DIAGNOSIS — J111 Influenza due to unidentified influenza virus with other respiratory manifestations: Secondary | ICD-10-CM | POA: Insufficient documentation

## 2013-01-31 DIAGNOSIS — IMO0002 Reserved for concepts with insufficient information to code with codable children: Secondary | ICD-10-CM | POA: Insufficient documentation

## 2013-01-31 MED ORDER — HYDROCODONE-HOMATROPINE 5-1.5 MG/5ML PO SYRP: 5.0000 mL | ORAL_SOLUTION | Freq: Four times a day (QID) | ORAL | Status: DC | PRN

## 2013-01-31 MED ORDER — PROMETHAZINE HCL 25 MG PO TABS: 25.0000 mg | ORAL_TABLET | Freq: Four times a day (QID) | ORAL | Status: DC | PRN

## 2013-01-31 NOTE — ED Notes (Signed)
Pt reports vomiting x 2, generalized body aches, fever x 2 days.

## 2013-01-31 NOTE — Discharge Instructions (Signed)
Take phenergan as needed for nausea and vomiting. Take hycodan as needed for cough. Refer to attached documents for more information. Return to the ED with worsening or concerning symptoms.

## 2013-01-31 NOTE — ED Provider Notes (Signed)
CSN: [id number]     Arrival date & time 01/31/13  1147 History   First MD Initiated Contact with Patient 01/31/13 1208     Chief Complaint  Patient presents with  . Emesis  . Generalized Body Aches  . Fever   (Consider location/radiation/quality/duration/timing/severity/associated sxs/prior Treatment) Patient is a 28 y.o. male presenting with vomiting and fever. The history is provided by the patient. No language interpreter was used.  Emesis Severity:  Moderate Duration:  2 days Timing:  Intermittent Number of daily episodes:  2 Quality:  Stomach contents Able to tolerate:  Liquids How soon after eating does vomiting occur:  1 hour Progression:  Improving Chronicity:  New Recent urination:  Normal Context: not post-tussive and not self-induced   Relieved by:  None tried Worsened by:  Nothing tried Ineffective treatments:  None tried Associated symptoms: cough and myalgias   Associated symptoms: no abdominal pain, no arthralgias, no chills and no diarrhea   Cough:    Cough characteristics:  Hacking   Sputum characteristics:  Nondescript   Severity:  Moderate   Onset quality:  Gradual   Duration:  2 days   Timing:  Unable to specify   Progression:  Unchanged   Chronicity:  New Myalgias:    Location:  Generalized   Quality:  Aching   Severity:  Moderate   Onset quality:  Gradual   Duration:  2 days   Timing:  Constant   Progression:  Unchanged Risk factors: sick contacts   Risk factors: no alcohol use, no diabetes and no travel to endemic areas   Fever Max temp prior to arrival:  100 Temp source:  Oral Severity:  Moderate Onset quality:  Gradual Duration:  2 days Timing:  Constant Progression:  Unchanged Chronicity:  New Relieved by:  Ibuprofen Worsened by:  Nothing tried Associated symptoms: myalgias and vomiting   Associated symptoms: no chest pain, no chills, no diarrhea, no dysuria and no nausea     Past Medical History  Diagnosis Date  . Kidney  calculus   . Heroin abuse    Past Surgical History  Procedure Laterality Date  . Kidney stone surgery     No family history on file. History  Substance Use Topics  . Smoking status: Current Every Day Smoker -- 0.50 packs/day    Types: Cigarettes  . Smokeless tobacco: Not on file  . Alcohol Use: Yes     Comment: occassion    Review of Systems  Constitutional: Positive for fever. Negative for chills and fatigue.  HENT: Negative for trouble swallowing.   Eyes: Negative for visual disturbance.  Respiratory: Negative for shortness of breath.   Cardiovascular: Negative for chest pain and palpitations.  Gastrointestinal: Positive for vomiting. Negative for nausea, abdominal pain and diarrhea.  Genitourinary: Negative for dysuria and difficulty urinating.  Musculoskeletal: Positive for myalgias. Negative for arthralgias and neck pain.  Skin: Negative for color change.  Neurological: Negative for dizziness and weakness.  Psychiatric/Behavioral: Negative for dysphoric mood.    Allergies  Review of patient's allergies indicates no known allergies.  Home Medications   Current Outpatient Rx  Name  Route  Sig  Dispense  Refill  . clonazePAM (KLONOPIN) 0.5 MG tablet   Oral   Take 1-2 mg by mouth 3 (three) times daily as needed for anxiety.         . diphenhydrAMINE (BENADRYL) 25 MG tablet   Oral   Take 1 tablet (25 mg total) by mouth every 6 (six) hours.  20 tablet   0   . hydrocortisone cream 1 %      Apply to affected area 2 times daily   15 g   0    BP 133/87  Pulse 82  Temp(Src) 97.9 F (36.6 C) (Oral)  Resp 16  Ht 5\' 9"  (1.753 m)  Wt 140 lb (63.504 kg)  BMI 20.67 kg/m2  SpO2 100% Physical Exam  Nursing note and vitals reviewed. Constitutional: He is oriented to person, place, and time. He appears well-developed and well-nourished. No distress.  HENT:  Head: Normocephalic and atraumatic.  Mouth/Throat: Oropharynx is clear and moist. No oropharyngeal  exudate.  Eyes: Conjunctivae and EOM are normal.  Neck: Normal range of motion.  Cardiovascular: Normal rate and regular rhythm.  Exam reveals no gallop and no friction rub.   No murmur heard. Pulmonary/Chest: Effort normal and breath sounds normal. He has no wheezes. He has no rales. He exhibits no tenderness.  Abdominal: Soft. He exhibits no distension. There is no tenderness. There is no rebound and no guarding.  Musculoskeletal: Normal range of motion.  Neurological: He is alert and oriented to person, place, and time. Coordination normal.  Speech is goal-oriented. Moves limbs without ataxia.   Skin: Skin is warm and dry.  Psychiatric: He has a normal mood and affect. His behavior is normal.    ED Course  Procedures (including critical care time) Labs Review Labs Reviewed - No data to display Imaging Review No results found.  EKG Interpretation   None       MDM   1. Influenza-like illness     12:24 PM Patient likely has a viral illness. Vitals stable and patient afebrile. Patient will have symptomatic treatment at this time. Patient advised to return with worsening or concerning symptoms.    Emilia BeckKaitlyn Aditi Rovira, PA-C 01/31/13 1230

## 2013-02-08 NOTE — ED Provider Notes (Signed)
Medical screening examination/treatment/procedure(s) were performed by non-physician practitioner and as supervising physician I was immediately available for consultation/collaboration.  EKG Interpretation   None         Aysha Livecchi, MD 02/08/13 0653 

## 2013-02-20 IMAGING — CT CT ABD-PELV W/O CM
1 series · 14 of 17 positions shown, 19 images · non-contrast
Comparison: August 05, 2009

CLINICAL DATA: Left flank pain and hematuria

CT ABDOMEN AND PELVIS WITHOUT CONTRAST
TECHNIQUE: Multidetector CT imaging of the abdomen and pelvis was
performed following the standard protocol without intravenous
contrast.

[Series 4: lung · axial · 0.74mm/px · z∈[+1484,+1554]mm · 14 of 17 slices shown, 19 images]
[im 2/17  soft-tissue]
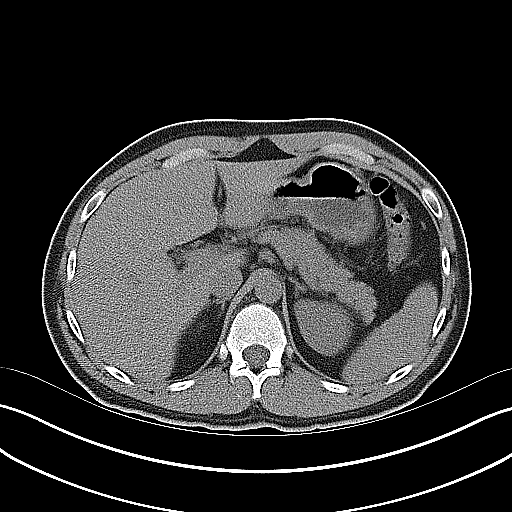
[im 2/17  bone]
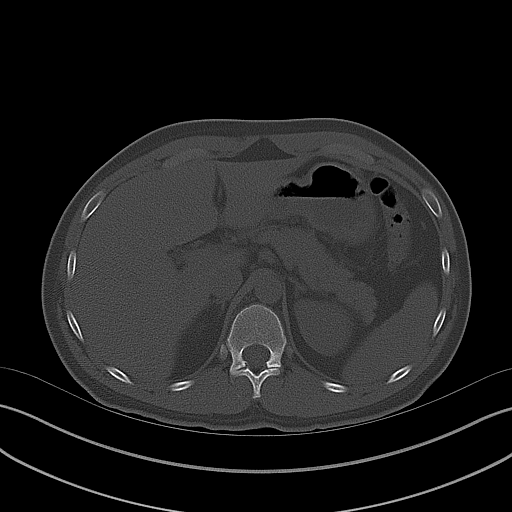
[im 3/17  soft-tissue]
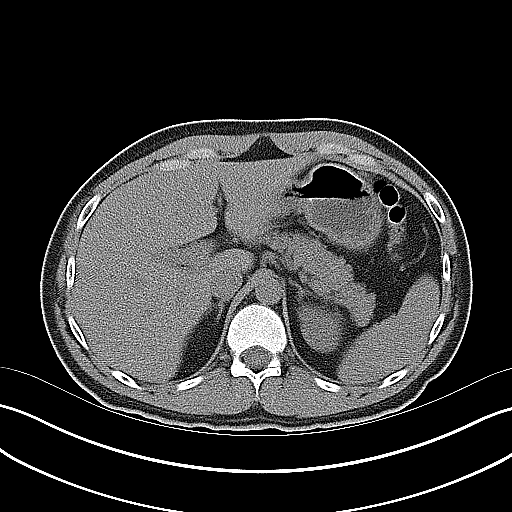
[im 4/17  soft-tissue]
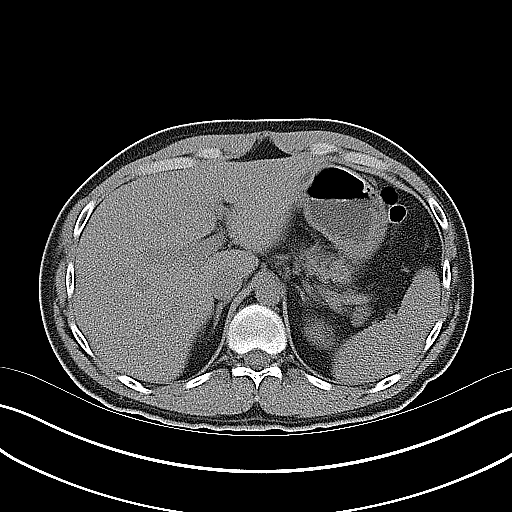
[im 6/17  soft-tissue]
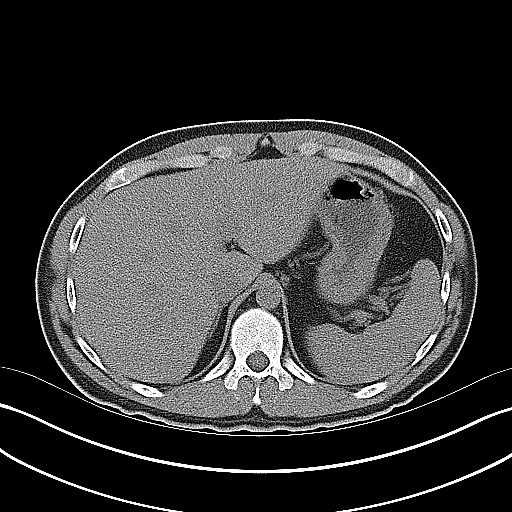
[im 7/17  soft-tissue]
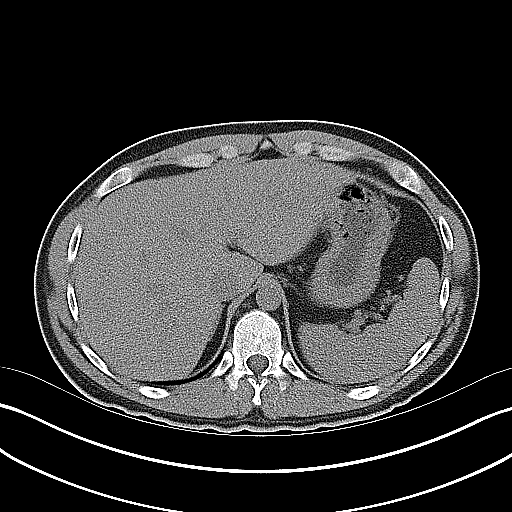
[im 8/17  soft-tissue]
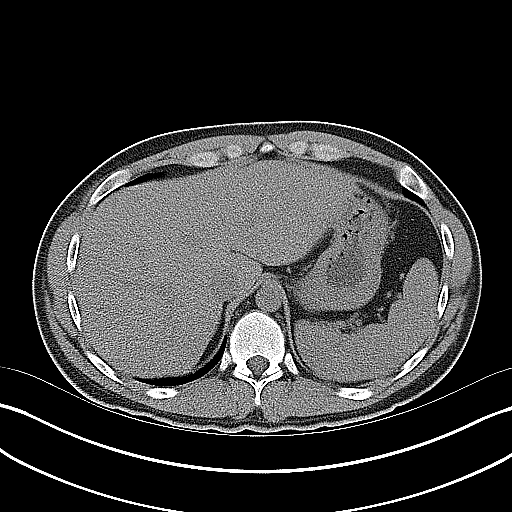
[im 9/17  soft-tissue]
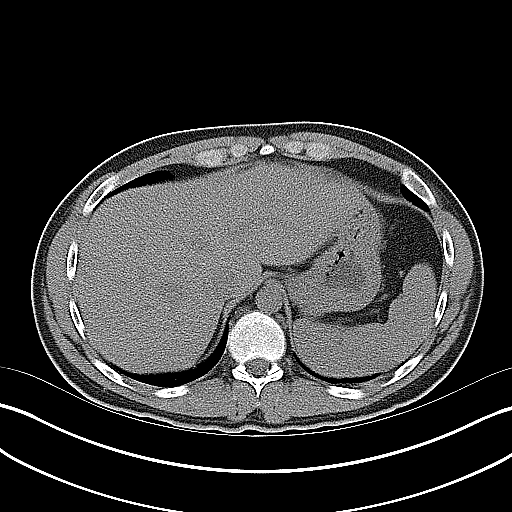
[im 10/17  soft-tissue]
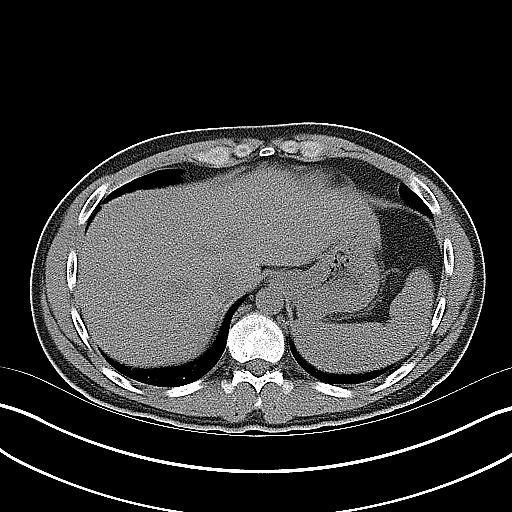
[im 11/17  soft-tissue]
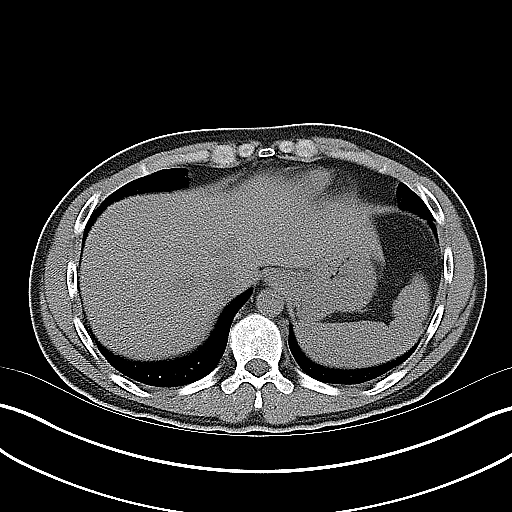
[im 11/17  bone]
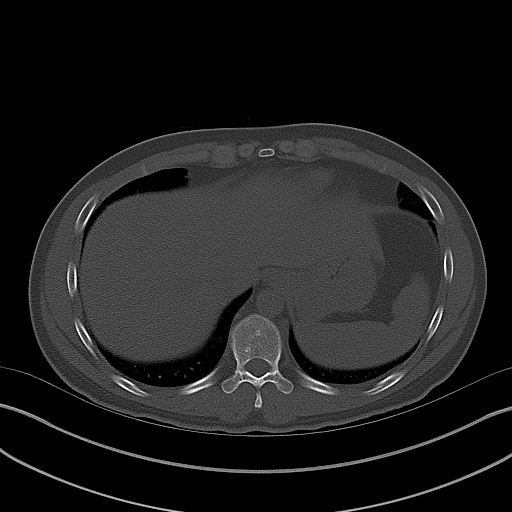
[im 12/17  soft-tissue]
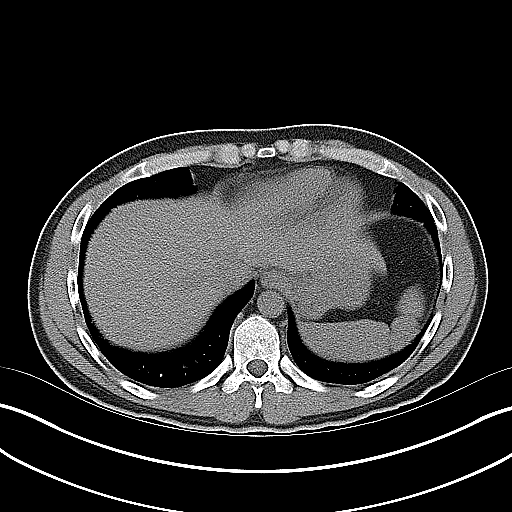
[im 13/17  lung]
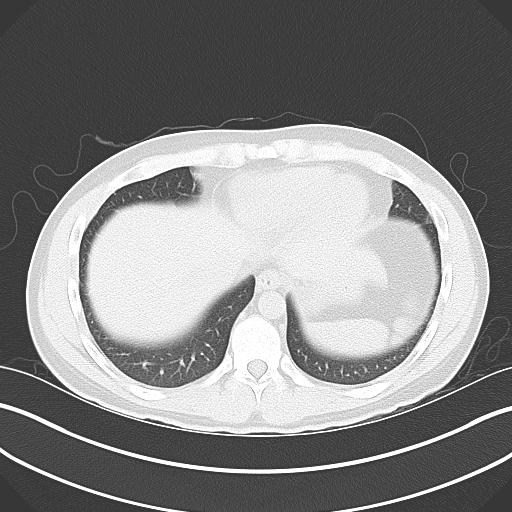
[im 14/17  soft-tissue]
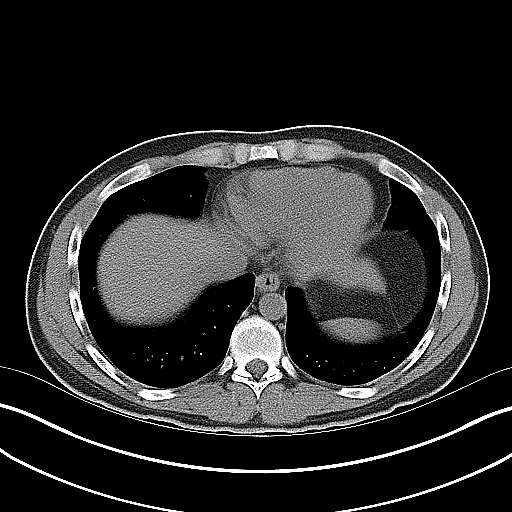
[im 14/17  lung]
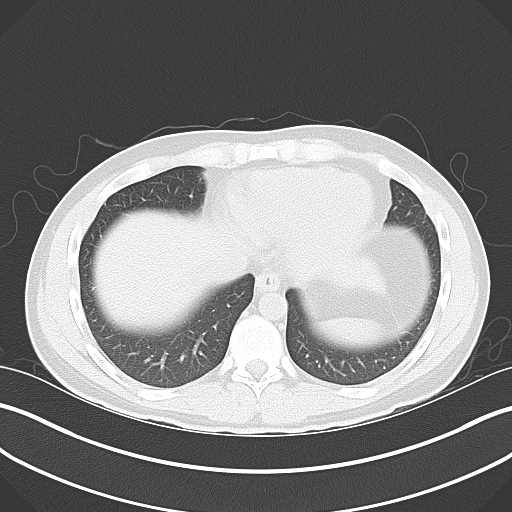
[im 15/17  soft-tissue]
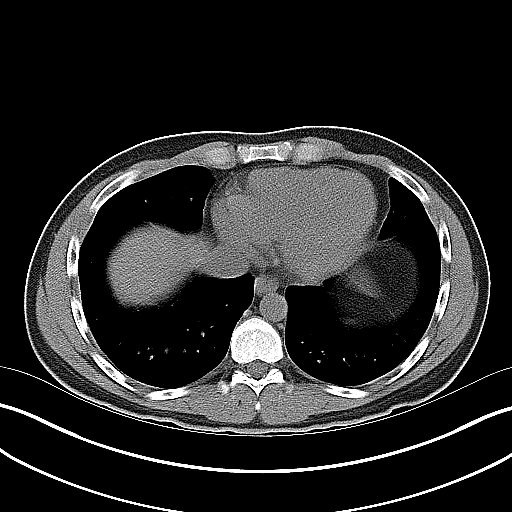
[im 15/17  lung]
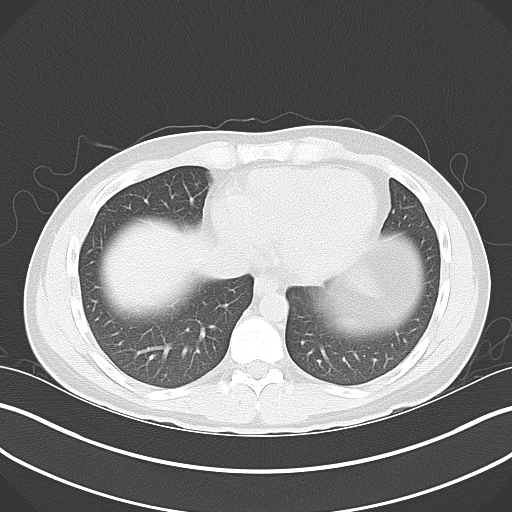
[im 16/17  soft-tissue]
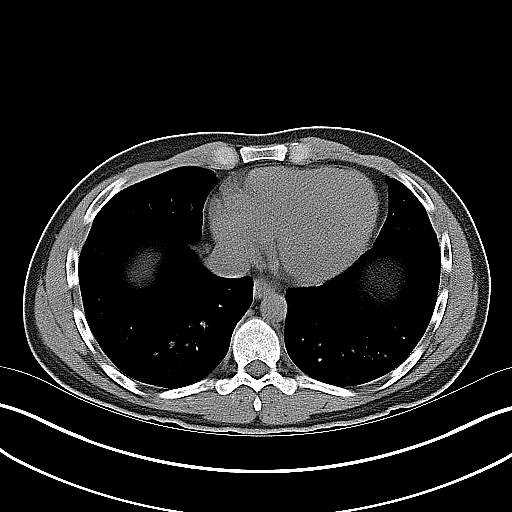
[im 16/17  lung]
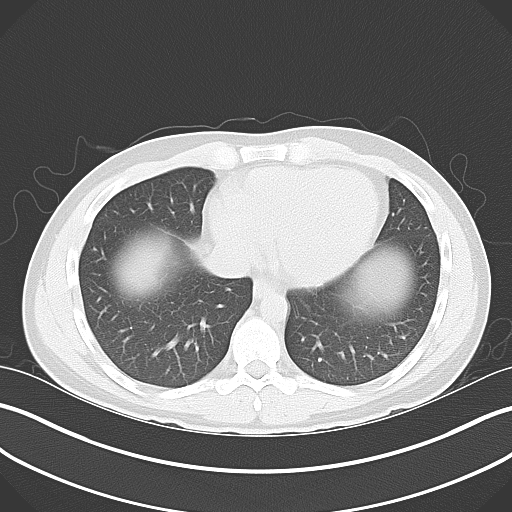

[14 of 17 positions shown; findings below may reference images not displayed]

FINDINGS: The lung bases are clear.  Layering biliary sludge is
suspected dependently within the gallbladder lumen.  The liver,
spleen, pancreas, adrenal glands, urinary bladder, osseous
structures have an unremarkable noncontrasted appearance.  The
appendix is seen in the right lower quadrant and is normal.  The
remainder of the bowel is unremarkable with no evidence of
obstruction or inflammation.  There is no free fluid or adenopathy
within the abdomen or pelvis.  There is a small right inguinal
hernia containing fat only. There are diffuse disc bulges at L4-L5
and L5-S1.

There is no evidence of hydronephrosis, hydroureter, renal
enlargement, or perinephric stranding bilaterally.  There are small
bilateral calyceal calculi.  These do not appear significantly
changed compared with the prior study.
IMPRESSION: There are bilateral nonobstructing renal calyceal calculi.  There
is no evidence of renal obstruction or ureteral calculi.

Small right inguinal hernia containing fat only.

Diffuse disc bulges at L4-L5 and L5-S1.

## 2013-02-21 ENCOUNTER — Encounter (HOSPITAL_BASED_OUTPATIENT_CLINIC_OR_DEPARTMENT_OTHER): Payer: Self-pay | Admitting: Emergency Medicine

## 2013-02-21 ENCOUNTER — Emergency Department (HOSPITAL_BASED_OUTPATIENT_CLINIC_OR_DEPARTMENT_OTHER)
Admission: EM | Admit: 2013-02-21 | Discharge: 2013-02-21 | Disposition: A | Discharge status: Home / Self Care | Payer: Self-pay | Attending: Emergency Medicine | Admitting: Emergency Medicine

## 2013-02-21 DIAGNOSIS — Z87442 Personal history of urinary calculi: Secondary | ICD-10-CM | POA: Insufficient documentation

## 2013-02-21 DIAGNOSIS — N23 Unspecified renal colic: Secondary | ICD-10-CM | POA: Insufficient documentation

## 2013-02-21 DIAGNOSIS — R110 Nausea: Secondary | ICD-10-CM | POA: Insufficient documentation

## 2013-02-21 DIAGNOSIS — F17200 Nicotine dependence, unspecified, uncomplicated: Secondary | ICD-10-CM | POA: Insufficient documentation

## 2013-02-21 DIAGNOSIS — IMO0002 Reserved for concepts with insufficient information to code with codable children: Secondary | ICD-10-CM | POA: Insufficient documentation

## 2013-02-21 DIAGNOSIS — Z8659 Personal history of other mental and behavioral disorders: Secondary | ICD-10-CM | POA: Insufficient documentation

## 2013-02-21 DIAGNOSIS — Z79899 Other long term (current) drug therapy: Secondary | ICD-10-CM | POA: Insufficient documentation

## 2013-02-21 LAB — URINALYSIS, ROUTINE W REFLEX MICROSCOPIC
Bilirubin Urine: NEGATIVE
Glucose, UA: NEGATIVE mg/dL
Hgb urine dipstick: NEGATIVE
KETONES UR: NEGATIVE mg/dL
LEUKOCYTES UA: NEGATIVE
NITRITE: NEGATIVE
PH: 5.5 (ref 5.0–8.0)
Protein, ur: NEGATIVE mg/dL
SPECIFIC GRAVITY, URINE: 1.026 (ref 1.005–1.030)
Urobilinogen, UA: 1 mg/dL (ref 0.0–1.0)

## 2013-02-21 MED ORDER — HYDROCODONE-ACETAMINOPHEN 5-325 MG PO TABS: 2.0000 | ORAL_TABLET | ORAL | Status: DC | PRN

## 2013-02-21 MED ORDER — TAMSULOSIN HCL 0.4 MG PO CAPS
0.4000 mg | ORAL_CAPSULE | Freq: Once | ORAL | Status: AC
  Administered 2013-02-21: 0.4 mg via ORAL
  Filled 2013-02-21: qty 1

## 2013-02-21 MED ORDER — KETOROLAC TROMETHAMINE 30 MG/ML IJ SOLN
30.0000 mg | Freq: Once | INTRAMUSCULAR | Status: AC
  Administered 2013-02-21: 30 mg via INTRAVENOUS
  Filled 2013-02-21: qty 1

## 2013-02-21 MED ORDER — HYDROMORPHONE HCL PF 1 MG/ML IJ SOLN
1.0000 mg | INTRAMUSCULAR | Status: DC | PRN
  Administered 2013-02-21: 1 mg via INTRAVENOUS
  Filled 2013-02-21: qty 1

## 2013-02-21 MED ORDER — TAMSULOSIN HCL 0.4 MG PO CAPS: 0.4000 mg | ORAL_CAPSULE | Freq: Every day | ORAL | Status: DC

## 2013-02-21 MED ORDER — ONDANSETRON HCL 4 MG/2ML IJ SOLN
4.0000 mg | Freq: Once | INTRAMUSCULAR | Status: AC
  Administered 2013-02-21: 4 mg via INTRAVENOUS
  Filled 2013-02-21: qty 2

## 2013-02-21 MED ORDER — SODIUM CHLORIDE 0.9 % IV BOLUS (SEPSIS)
1000.0000 mL | Freq: Once | INTRAVENOUS | Status: AC
  Administered 2013-02-21: 1000 mL via INTRAVENOUS

## 2013-02-21 MED ORDER — ONDANSETRON 4 MG PO TBDP: 4.0000 mg | ORAL_TABLET | Freq: Three times a day (TID) | ORAL | Status: DC | PRN

## 2013-02-21 MED ORDER — HYDROMORPHONE HCL PF 1 MG/ML IJ SOLN
1.0000 mg | Freq: Once | INTRAMUSCULAR | Status: AC
  Administered 2013-02-21: 1 mg via INTRAVENOUS
  Filled 2013-02-21: qty 1

## 2013-02-21 NOTE — Discharge Instructions (Signed)
Kidney Stones  Kidney stones (urolithiasis) are deposits that form inside your kidneys. The intense pain is caused by the stone moving through the urinary tract. When the stone moves, the ureter goes into spasm around the stone. The stone is usually passed in the urine.   CAUSES   · A disorder that makes certain neck glands produce too much parathyroid hormone (primary hyperparathyroidism).  · A buildup of uric acid crystals, similar to gout in your joints.  · Narrowing (stricture) of the ureter.  · A kidney obstruction present at birth (congenital obstruction).  · Previous surgery on the kidney or ureters.  · Numerous kidney infections.  SYMPTOMS   · Feeling sick to your stomach (nauseous).  · Throwing up (vomiting).  · Blood in the urine (hematuria).  · Pain that usually spreads (radiates) to the groin.  · Frequency or urgency of urination.  DIAGNOSIS   · Taking a history and physical exam.  · Blood or urine tests.  · CT scan.  · Occasionally, an examination of the inside of the urinary bladder (cystoscopy) is performed.  TREATMENT   · Observation.  · Increasing your fluid intake.  · Extracorporeal shock wave lithotripsy This is a noninvasive procedure that uses shock waves to break up kidney stones.  · Surgery may be needed if you have severe pain or persistent obstruction. There are various surgical procedures. Most of the procedures are performed with the use of small instruments. Only small incisions are needed to accommodate these instruments, so recovery time is minimized.  The size, location, and chemical composition are all important variables that will determine the proper choice of action for you. Talk to your health care provider to better understand your situation so that you will minimize the risk of injury to yourself and your kidney.   HOME CARE INSTRUCTIONS   · Drink enough water and fluids to keep your urine clear or pale yellow. This will help you to pass the stone or stone fragments.  · Strain  all urine through the provided strainer. Keep all particulate matter and stones for your health care provider to see. The stone causing the pain may be as small as a grain of salt. It is very important to use the strainer each and every time you pass your urine. The collection of your stone will allow your health care provider to analyze it and verify that a stone has actually passed. The stone analysis will often identify what you can do to reduce the incidence of recurrences.  · Only take over-the-counter or prescription medicines for pain, discomfort, or fever as directed by your health care provider.  · Make a follow-up appointment with your health care provider as directed.  · Get follow-up X-rays if required. The absence of pain does not always mean that the stone has passed. It may have only stopped moving. If the urine remains completely obstructed, it can cause loss of kidney function or even complete destruction of the kidney. It is your responsibility to make sure X-rays and follow-ups are completed. Ultrasounds of the kidney can show blockages and the status of the kidney. Ultrasounds are not associated with any radiation and can be performed easily in a matter of minutes.  SEEK MEDICAL CARE IF:  · You experience pain that is progressive and unresponsive to any pain medicine you have been prescribed.  SEEK IMMEDIATE MEDICAL CARE IF:   · Pain cannot be controlled with the prescribed medicine.  · You have a fever   or shaking chills.  · The severity or intensity of pain increases over 18 hours and is not relieved by pain medicine.  · You develop a new onset of abdominal pain.  · You feel faint or pass out.  · You are unable to urinate.  MAKE SURE YOU:   · Understand these instructions.  · Will watch your condition.  · Will get help right away if you are not doing well or get worse.  Document Released: 01/09/2005 Document Revised: 09/11/2012 Document Reviewed: 06/12/2012  [employer]® Patient Information ©2014  [employer].

## 2013-02-21 NOTE — ED Provider Notes (Signed)
CSN: [id number]     Arrival date & time 02/21/13  1446 History   First MD Initiated Contact with Patient 02/21/13 1456     Chief Complaint  Patient presents with  . Flank Pain    HPI  Patient presents with flank pain since this morning. He has a history of multiple ureteral stones. Most recent CT scan 2013 showed multiple small bilateral nephrolithiasis. He states he had a basket [facility] on last year. Has had no symptoms for 9-12 months. Symptoms started this morning. Colicky and intermittent. Associated with nausea. No gross hematuria. No fever.  Past Medical History  Diagnosis Date  . Kidney calculus   . Heroin abuse    Past Surgical History  Procedure Laterality Date  . Kidney stone surgery     No family history on file. History  Substance Use Topics  . Smoking status: Current Every Day Smoker -- 0.50 packs/day    Types: Cigarettes  . Smokeless tobacco: Not on file  . Alcohol Use: Yes     Comment: occassion    Review of Systems  Constitutional: Negative for fever, chills, diaphoresis, appetite change and fatigue.  HENT: Negative for mouth sores, sore throat and trouble swallowing.   Eyes: Negative for visual disturbance.  Respiratory: Negative for cough, chest tightness, shortness of breath and wheezing.   Cardiovascular: Negative for chest pain.  Gastrointestinal: Positive for nausea. Negative for vomiting, abdominal pain, diarrhea and abdominal distention.  Endocrine: Negative for polydipsia, polyphagia and polyuria.  Genitourinary: Positive for flank pain. Negative for dysuria, urgency, frequency, hematuria and decreased urine volume.  Musculoskeletal: Negative for gait problem.  Skin: Negative for color change, pallor and rash.  Neurological: Negative for dizziness, syncope, light-headedness and headaches.  Hematological: Does not bruise/bleed easily.  Psychiatric/Behavioral: Negative for behavioral problems and confusion.    Allergies   Review of patient's allergies indicates no known allergies.  Home Medications   Current Outpatient Rx  Name  Route  Sig  Dispense  Refill  . clonazePAM (KLONOPIN) 0.5 MG tablet   Oral   Take 1-2 mg by mouth 3 (three) times daily as needed for anxiety.         . diphenhydrAMINE (BENADRYL) 25 MG tablet   Oral   Take 1 tablet (25 mg total) by mouth every 6 (six) hours.   20 tablet   0   . HYDROcodone-acetaminophen (NORCO/VICODIN) 5-325 MG per tablet   Oral   Take 2 tablets by mouth every 4 (four) hours as needed.   20 tablet   0   . HYDROcodone-homatropine (HYCODAN) 5-1.5 MG/5ML syrup   Oral   Take 5 mLs by mouth every 6 (six) hours as needed for cough.   120 mL   0   . hydrocortisone cream 1 %      Apply to affected area 2 times daily   15 g   0   . ondansetron (ZOFRAN ODT) 4 MG disintegrating tablet   Oral   Take 1 tablet (4 mg total) by mouth every 8 (eight) hours as needed for nausea.   20 tablet   0   . promethazine (PHENERGAN) 25 MG tablet   Oral   Take 1 tablet (25 mg total) by mouth every 6 (six) hours as needed for nausea or vomiting.   12 tablet   0   . tamsulosin (FLOMAX) 0.4 MG CAPS capsule   Oral   Take 1 capsule (0.4 mg total) by mouth daily.  7 capsule   0    BP 112/78  Pulse 92  Temp(Src) 97.9 F (36.6 C) (Oral)  Resp 16  Ht 5\' 8"  (1.727 m)  Wt 140 lb (63.504 kg)  BMI 21.29 kg/m2  SpO2 99% Physical Exam  Constitutional: He is oriented to person, place, and time. He appears well-developed and well-nourished. No distress.  Appears uncomfortable. Holds his right hand on his right flank.  HENT:  Head: Normocephalic.  Eyes: Conjunctivae are normal. Pupils are equal, round, and reactive to light. No scleral icterus.  Neck: Normal range of motion. Neck supple. No thyromegaly present.  Cardiovascular: Normal rate and regular rhythm.  Exam reveals no gallop and no friction rub.   No murmur heard. Pulmonary/Chest: Effort normal and breath  sounds normal. No respiratory distress. He has no wheezes. He has no rales.  Abdominal: Soft. Bowel sounds are normal. He exhibits no distension. There is no tenderness. There is no rebound.  No tenderness in the flank or into his abdomen. Benign abdominal exam.  Musculoskeletal: Normal range of motion.  No tenderness or rash in the flank or the paraspinal musculature.  Neurological: He is alert and oriented to person, place, and time.  Skin: Skin is warm and dry. No rash noted.  Psychiatric: He has a normal mood and affect. His behavior is normal.    ED Course  Procedures (including critical care time) Labs Review Labs Reviewed  URINALYSIS, ROUTINE W REFLEX MICROSCOPIC   Imaging Review No results found.  EKG Interpretation   None       MDM   1. Ureteral colic      No indication that he needs to be reimaged now. He's had multiple CT scans in the last several years. Shows no signs of infection his urine. However no blood either. Plan will be attempt at symptom control here. Outpatient treatment. Urological followup    Rolland Porter, MD 02/21/13 1616

## 2013-02-21 NOTE — ED Notes (Signed)
Right flank pain since this am. States he has a hx of kidney stones and knows this pain is a result of a stone.

## 2013-04-17 ENCOUNTER — Emergency Department (HOSPITAL_BASED_OUTPATIENT_CLINIC_OR_DEPARTMENT_OTHER): Payer: Self-pay

## 2013-04-17 ENCOUNTER — Encounter (HOSPITAL_BASED_OUTPATIENT_CLINIC_OR_DEPARTMENT_OTHER): Payer: Self-pay | Admitting: Emergency Medicine

## 2013-04-17 ENCOUNTER — Emergency Department (HOSPITAL_BASED_OUTPATIENT_CLINIC_OR_DEPARTMENT_OTHER)
Admission: EM | Admit: 2013-04-17 | Discharge: 2013-04-17 | Disposition: A | Discharge status: Other Acute Inpatient Hospital | Payer: Self-pay | Attending: Emergency Medicine | Admitting: Emergency Medicine

## 2013-04-17 DIAGNOSIS — IMO0002 Reserved for concepts with insufficient information to code with codable children: Secondary | ICD-10-CM | POA: Insufficient documentation

## 2013-04-17 DIAGNOSIS — Z79899 Other long term (current) drug therapy: Secondary | ICD-10-CM | POA: Insufficient documentation

## 2013-04-17 DIAGNOSIS — Z87442 Personal history of urinary calculi: Secondary | ICD-10-CM | POA: Insufficient documentation

## 2013-04-17 DIAGNOSIS — S5420XA Injury of radial nerve at forearm level, unspecified arm, initial encounter: Secondary | ICD-10-CM | POA: Insufficient documentation

## 2013-04-17 DIAGNOSIS — F1110 Opioid abuse, uncomplicated: Secondary | ICD-10-CM | POA: Insufficient documentation

## 2013-04-17 DIAGNOSIS — X58XXXA Exposure to other specified factors, initial encounter: Secondary | ICD-10-CM | POA: Insufficient documentation

## 2013-04-17 DIAGNOSIS — S5421XA Injury of radial nerve at forearm level, right arm, initial encounter: Secondary | ICD-10-CM

## 2013-04-17 DIAGNOSIS — F17200 Nicotine dependence, unspecified, uncomplicated: Secondary | ICD-10-CM | POA: Insufficient documentation

## 2013-04-17 DIAGNOSIS — Y929 Unspecified place or not applicable: Secondary | ICD-10-CM | POA: Insufficient documentation

## 2013-04-17 DIAGNOSIS — Y939 Activity, unspecified: Secondary | ICD-10-CM | POA: Insufficient documentation

## 2013-04-17 LAB — BASIC METABOLIC PANEL
BUN: 10 mg/dL (ref 6–23)
CHLORIDE: 103 meq/L (ref 96–112)
CO2: 25 mEq/L (ref 19–32)
Calcium: 9.7 mg/dL (ref 8.4–10.5)
Creatinine, Ser: 1 mg/dL (ref 0.50–1.35)
GLUCOSE: 99 mg/dL (ref 70–99)
POTASSIUM: 4 meq/L (ref 3.7–5.3)
SODIUM: 140 meq/L (ref 137–147)

## 2013-04-17 LAB — CBC
HCT: 46.4 % (ref 39.0–52.0)
HEMOGLOBIN: 16.3 g/dL (ref 13.0–17.0)
MCH: 32.8 pg (ref 26.0–34.0)
MCHC: 35.1 g/dL (ref 30.0–36.0)
MCV: 93.4 fL (ref 78.0–100.0)
Platelets: 142 10*3/uL — ABNORMAL LOW (ref 150–400)
RBC: 4.97 MIL/uL (ref 4.22–5.81)
RDW: 12.3 % (ref 11.5–15.5)
WBC: 7.9 10*3/uL (ref 4.0–10.5)

## 2013-04-17 NOTE — ED Notes (Signed)
Right arm pain and numbness to right thumb that started yesterday. Denies injury.

## 2013-04-17 NOTE — ED Notes (Signed)
Pt. Is going to ED at [facility].

## 2013-04-17 NOTE — ED Provider Notes (Signed)
CSN: [id number]     Arrival date & time 04/17/13  1040 History   First MD Initiated Contact with Patient 04/17/13 1058     Chief Complaint  Patient presents with  . Arm Pain     (Consider location/radiation/quality/duration/timing/severity/associated sxs/prior Treatment) HPI Comments: R thumb numbness began last night. Worse upon awakening with associated R arm pain.  No trauma, no falls, did not sleep on arm.   Patient is a 28 y.o. male presenting with arm pain. The history is provided by the patient.  Arm Pain This is a new problem. The current episode started 6 to 12 hours ago. The problem occurs constantly. The problem has been gradually worsening. Pertinent negatives include no chest pain, no abdominal pain, no headaches and no shortness of breath. Nothing aggravates the symptoms. Nothing relieves the symptoms.    Past Medical History  Diagnosis Date  . Kidney calculus   . Heroin abuse    Past Surgical History  Procedure Laterality Date  . Kidney stone surgery     No family history on file. History  Substance Use Topics  . Smoking status: Current Every Day Smoker -- 1.00 packs/day    Types: Cigarettes  . Smokeless tobacco: Not on file  . Alcohol Use: Yes     Comment: occassion    Review of Systems  Constitutional: Negative for fever and chills.  Respiratory: Negative for shortness of breath.   Cardiovascular: Negative for chest pain.  Gastrointestinal: Negative for abdominal pain.  Neurological: Negative for headaches.  All other systems reviewed and are negative.      Allergies  Review of patient's allergies indicates no known allergies.  Home Medications   Current Outpatient Rx  Name  Route  Sig  Dispense  Refill  . clonazePAM (KLONOPIN) 0.5 MG tablet   Oral   Take 1-2 mg by mouth 3 (three) times daily as needed for anxiety.         . diphenhydrAMINE (BENADRYL) 25 MG tablet   Oral   Take 1 tablet (25 mg total) by mouth every 6 (six) hours.   20  tablet   0   . HYDROcodone-acetaminophen (NORCO/VICODIN) 5-325 MG per tablet   Oral   Take 2 tablets by mouth every 4 (four) hours as needed.   20 tablet   0   . HYDROcodone-homatropine (HYCODAN) 5-1.5 MG/5ML syrup   Oral   Take 5 mLs by mouth every 6 (six) hours as needed for cough.   120 mL   0   . hydrocortisone cream 1 %      Apply to affected area 2 times daily   15 g   0   . ondansetron (ZOFRAN ODT) 4 MG disintegrating tablet   Oral   Take 1 tablet (4 mg total) by mouth every 8 (eight) hours as needed for nausea.   20 tablet   0   . promethazine (PHENERGAN) 25 MG tablet   Oral   Take 1 tablet (25 mg total) by mouth every 6 (six) hours as needed for nausea or vomiting.   12 tablet   0   . tamsulosin (FLOMAX) 0.4 MG CAPS capsule   Oral   Take 1 capsule (0.4 mg total) by mouth daily.   7 capsule   0    BP 150/113  Pulse 82  Temp(Src) 98.1 F (36.7 C) (Oral)  Resp 16  Ht 5\' 9"  (1.753 m)  Wt 140 lb (63.504 kg)  BMI 20.67 kg/m2  SpO2 97%  Physical Exam  Nursing note and vitals reviewed. Constitutional: He is oriented to person, place, and time. He appears well-developed and well-nourished. No distress.  HENT:  Head: Normocephalic and atraumatic.  Mouth/Throat: No oropharyngeal exudate.  Eyes: EOM are normal. Pupils are equal, round, and reactive to light.  Neck: Normal range of motion. Neck supple.  Cardiovascular: Normal rate and regular rhythm.  Exam reveals no friction rub.   No murmur heard. Pulmonary/Chest: Effort normal and breath sounds normal. No respiratory distress. He has no wheezes. He has no rales.  Abdominal: He exhibits no distension. There is no tenderness. There is no rebound.  Musculoskeletal: Normal range of motion. He exhibits no edema.  R tricep 4/5 strength R thumb with sensory loss R dorsal forearm with altered light touch sensation. Thumb strong, no decreased thumb strength  Neurological: He is alert and oriented to person,  place, and time.  Skin: He is not diaphoretic.    ED Course  Procedures (including critical care time) Labs Review Labs Reviewed - No data to display Imaging Review Ct Head Wo Contrast  04/17/2013   CLINICAL DATA:  Hypertension, left arm pain  EXAM: CT HEAD WITHOUT CONTRAST  TECHNIQUE: Contiguous axial images were obtained from the base of the skull through the vertex without intravenous contrast.  COMPARISON:  CT HEAD W/O CM dated 08/13/2012  FINDINGS: There is no evidence of mass effect, midline shift or extra-axial fluid collections. There is no evidence of a space-occupying lesion or intracranial hemorrhage. There is no evidence of a cortical-based area of acute infarction.  The ventricles and sulci are appropriate for the patient's age. The basal cisterns are patent.  Visualized portions of the orbits are unremarkable. The visualized portions of the paranasal sinuses and mastoid air cells are unremarkable.  The osseous structures are unremarkable.  IMPRESSION: Normal CT of the brain without intravenous contrast.   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 Thumb numbness started last night, R arm pain and worsening R thumb/arm numbness continued upon wakening this morning. On exam has R radial nerve numbness and tricep weakness. Thumb strong. I spoke with Dr. Cyril Mourningamillo with Neurology who is concerned this could be a stroke. Will start with CT and labs.  CT normal. Dr. Cyril Mourningamillo recommending MRI. [facility] - Dr. Elesa MassedWard accepting.  Dagmar HaitWilliam Hazelee Harbold, MD 04/17/13 [phone]

## 2014-05-05 ENCOUNTER — Inpatient Hospital Stay (HOSPITAL_COMMUNITY): Payer: Self-pay | Admitting: Certified Registered Nurse Anesthetist

## 2014-05-05 ENCOUNTER — Encounter (HOSPITAL_COMMUNITY): Payer: Self-pay | Admitting: *Deleted

## 2014-05-05 ENCOUNTER — Encounter (HOSPITAL_COMMUNITY)
Admission: EM | Disposition: A | Discharge status: Home / Self Care | Payer: Self-pay | Source: Home / Self Care | Attending: Internal Medicine

## 2014-05-05 ENCOUNTER — Emergency Department (HOSPITAL_COMMUNITY): Payer: Self-pay

## 2014-05-05 ENCOUNTER — Inpatient Hospital Stay (HOSPITAL_COMMUNITY)
Admission: EM | Admit: 2014-05-05 | Discharge: 2014-05-07 | DRG: 694 | Disposition: A | Discharge status: Home / Self Care | Payer: Self-pay | Attending: Internal Medicine | Admitting: Internal Medicine

## 2014-05-05 DIAGNOSIS — Z8249 Family history of ischemic heart disease and other diseases of the circulatory system: Secondary | ICD-10-CM

## 2014-05-05 DIAGNOSIS — Z79899 Other long term (current) drug therapy: Secondary | ICD-10-CM

## 2014-05-05 DIAGNOSIS — N390 Urinary tract infection, site not specified: Secondary | ICD-10-CM | POA: Diagnosis present

## 2014-05-05 DIAGNOSIS — F17210 Nicotine dependence, cigarettes, uncomplicated: Secondary | ICD-10-CM | POA: Diagnosis present

## 2014-05-05 DIAGNOSIS — N136 Pyonephrosis: Secondary | ICD-10-CM | POA: Diagnosis present

## 2014-05-05 DIAGNOSIS — N200 Calculus of kidney: Secondary | ICD-10-CM | POA: Insufficient documentation

## 2014-05-05 DIAGNOSIS — Z79891 Long term (current) use of opiate analgesic: Secondary | ICD-10-CM

## 2014-05-05 DIAGNOSIS — Z87442 Personal history of urinary calculi: Secondary | ICD-10-CM

## 2014-05-05 DIAGNOSIS — D696 Thrombocytopenia, unspecified: Secondary | ICD-10-CM | POA: Diagnosis present

## 2014-05-05 DIAGNOSIS — Z791 Long term (current) use of non-steroidal anti-inflammatories (NSAID): Secondary | ICD-10-CM

## 2014-05-05 DIAGNOSIS — F1290 Cannabis use, unspecified, uncomplicated: Secondary | ICD-10-CM | POA: Diagnosis present

## 2014-05-05 DIAGNOSIS — N202 Calculus of kidney with calculus of ureter: Principal | ICD-10-CM | POA: Diagnosis present

## 2014-05-05 DIAGNOSIS — R52 Pain, unspecified: Secondary | ICD-10-CM

## 2014-05-05 HISTORY — PX: CYSTOSCOPY/RETROGRADE/URETEROSCOPY: SHX5316

## 2014-05-05 LAB — CBC WITH DIFFERENTIAL/PLATELET
Basophils Absolute: 0 10*3/uL (ref 0.0–0.1)
Basophils Relative: 0 % (ref 0–1)
EOS ABS: 0.1 10*3/uL (ref 0.0–0.7)
EOS PCT: 1 % (ref 0–5)
HEMATOCRIT: 42 % (ref 39.0–52.0)
Hemoglobin: 14.6 g/dL (ref 13.0–17.0)
LYMPHS ABS: 1.6 10*3/uL (ref 0.7–4.0)
Lymphocytes Relative: 19 % (ref 12–46)
MCH: 31.5 pg (ref 26.0–34.0)
MCHC: 34.8 g/dL (ref 30.0–36.0)
MCV: 90.7 fL (ref 78.0–100.0)
MONOS PCT: 8 % (ref 3–12)
Monocytes Absolute: 0.7 10*3/uL (ref 0.1–1.0)
NEUTROS ABS: 5.9 10*3/uL (ref 1.7–7.7)
Neutrophils Relative %: 72 % (ref 43–77)
Platelets: 123 10*3/uL — ABNORMAL LOW (ref 150–400)
RBC: 4.63 MIL/uL (ref 4.22–5.81)
RDW: 13.5 % (ref 11.5–15.5)
WBC: 8.3 10*3/uL (ref 4.0–10.5)

## 2014-05-05 LAB — COMPREHENSIVE METABOLIC PANEL
ALT: 246 U/L — AB (ref 0–53)
AST: 106 U/L — AB (ref 0–37)
Albumin: 4.1 g/dL (ref 3.5–5.2)
Alkaline Phosphatase: 70 U/L (ref 39–117)
Anion gap: 8 (ref 5–15)
BUN: 14 mg/dL (ref 6–23)
CALCIUM: 8.8 mg/dL (ref 8.4–10.5)
CO2: 23 mmol/L (ref 19–32)
Chloride: 111 mmol/L (ref 96–112)
Creatinine, Ser: 1.01 mg/dL (ref 0.50–1.35)
GFR calc Af Amer: >90 mL/min (ref >90)
Glucose, Bld: 105 mg/dL — ABNORMAL HIGH (ref 70–99)
POTASSIUM: 4.5 mmol/L (ref 3.5–5.1)
SODIUM: 142 mmol/L (ref 135–145)
Total Bilirubin: 0.6 mg/dL (ref 0.3–1.2)
Total Protein: 6.7 g/dL (ref 6.0–8.3)

## 2014-05-05 LAB — URINE MICROSCOPIC-ADD ON

## 2014-05-05 LAB — URINALYSIS, ROUTINE W REFLEX MICROSCOPIC
GLUCOSE, UA: NEGATIVE mg/dL
Ketones, ur: NEGATIVE mg/dL
Nitrite: POSITIVE — AB
PH: 5.5 (ref 5.0–8.0)
PROTEIN: 100 mg/dL — AB
Specific Gravity, Urine: 1.029 (ref 1.005–1.030)
Urobilinogen, UA: 1 mg/dL (ref 0.0–1.0)

## 2014-05-05 LAB — RAPID URINE DRUG SCREEN, HOSP PERFORMED
Amphetamines: NOT DETECTED
BARBITURATES: NOT DETECTED
BENZODIAZEPINES: POSITIVE — AB
COCAINE: POSITIVE — AB
OPIATES: POSITIVE — AB
Tetrahydrocannabinol: POSITIVE — AB

## 2014-05-05 LAB — ETHANOL

## 2014-05-05 LAB — MRSA PCR SCREENING: MRSA by PCR: NEGATIVE

## 2014-05-05 SURGERY — CYSTOSCOPY/RETROGRADE/URETEROSCOPY
Anesthesia: General | Site: Ureter | Laterality: Left

## 2014-05-05 MED ORDER — ONDANSETRON HCL 4 MG/2ML IJ SOLN: 4.0000 mg | Freq: Four times a day (QID) | INTRAMUSCULAR | Status: DC | PRN

## 2014-05-05 MED ORDER — ENOXAPARIN SODIUM 40 MG/0.4ML ~~LOC~~ SOLN
40.0000 mg | SUBCUTANEOUS | Status: DC
  Administered 2014-05-05 – 2014-05-06 (×2): 40 mg via SUBCUTANEOUS
  Filled 2014-05-05 (×3): qty 0.4

## 2014-05-05 MED ORDER — CEFTRIAXONE SODIUM 1 G IJ SOLR
1.0000 g | INTRAMUSCULAR | Status: DC
  Filled 2014-05-05: qty 10

## 2014-05-05 MED ORDER — FENTANYL CITRATE 0.05 MG/ML IJ SOLN
50.0000 ug | Freq: Once | INTRAMUSCULAR | Status: AC
  Administered 2014-05-05: 50 ug via INTRAVENOUS
  Filled 2014-05-05: qty 2

## 2014-05-05 MED ORDER — MIDAZOLAM HCL 5 MG/5ML IJ SOLN
INTRAMUSCULAR | Status: DC | PRN
  Administered 2014-05-05: 2 mg via INTRAVENOUS

## 2014-05-05 MED ORDER — ACETAMINOPHEN 10 MG/ML IV SOLN
1000.0000 mg | Freq: Once | INTRAVENOUS | Status: AC
Start: 2014-05-05 — End: 2014-05-05
  Administered 2014-05-05: 1000 mg via INTRAVENOUS
  Filled 2014-05-05: qty 100

## 2014-05-05 MED ORDER — CEFTRIAXONE SODIUM IN DEXTROSE 20 MG/ML IV SOLN
1.0000 g | INTRAVENOUS | Status: DC
  Administered 2014-05-06: 1 g via INTRAVENOUS
  Filled 2014-05-05 (×4): qty 50

## 2014-05-05 MED ORDER — ONDANSETRON HCL 4 MG PO TABS
4.0000 mg | ORAL_TABLET | Freq: Four times a day (QID) | ORAL | Status: DC | PRN
  Administered 2014-05-06: 4 mg via ORAL
  Filled 2014-05-05: qty 1

## 2014-05-05 MED ORDER — ONDANSETRON HCL 4 MG/2ML IJ SOLN
INTRAMUSCULAR | Status: AC
  Filled 2014-05-05: qty 2

## 2014-05-05 MED ORDER — LIDOCAINE HCL 2 % EX GEL
CUTANEOUS | Status: AC
  Filled 2014-05-05: qty 10

## 2014-05-05 MED ORDER — LIDOCAINE HCL 2 % EX GEL
CUTANEOUS | Status: DC | PRN
  Administered 2014-05-05: 1 via TOPICAL

## 2014-05-05 MED ORDER — HYDROCODONE-ACETAMINOPHEN 5-325 MG PO TABS
1.0000 | ORAL_TABLET | ORAL | Status: DC | PRN
  Administered 2014-05-05 – 2014-05-07 (×8): 2 via ORAL
  Filled 2014-05-05 (×9): qty 2

## 2014-05-05 MED ORDER — ONDANSETRON HCL 4 MG/2ML IJ SOLN
INTRAMUSCULAR | Status: DC | PRN
  Administered 2014-05-05: 4 mg via INTRAVENOUS

## 2014-05-05 MED ORDER — HYDROMORPHONE HCL 1 MG/ML IJ SOLN
INTRAMUSCULAR | Status: AC
  Filled 2014-05-05: qty 1

## 2014-05-05 MED ORDER — MORPHINE SULFATE 2 MG/ML IJ SOLN
1.0000 mg | INTRAMUSCULAR | Status: DC | PRN
  Administered 2014-05-06 – 2014-05-07 (×7): 1 mg via INTRAVENOUS
  Filled 2014-05-05 (×8): qty 1

## 2014-05-05 MED ORDER — LIDOCAINE HCL (CARDIAC) 20 MG/ML IV SOLN
INTRAVENOUS | Status: AC
  Filled 2014-05-05: qty 5

## 2014-05-05 MED ORDER — KETOROLAC TROMETHAMINE 30 MG/ML IJ SOLN
INTRAMUSCULAR | Status: DC | PRN
Start: 2014-05-05 — End: 2014-05-05
  Administered 2014-05-05: 30 mg via INTRAVENOUS

## 2014-05-05 MED ORDER — PROPOFOL 10 MG/ML IV BOLUS
INTRAVENOUS | Status: DC | PRN
Start: 2014-05-05 — End: 2014-05-05
  Administered 2014-05-05: 180 mg via INTRAVENOUS

## 2014-05-05 MED ORDER — PROPOFOL 10 MG/ML IV BOLUS
INTRAVENOUS | Status: AC
  Filled 2014-05-05: qty 20

## 2014-05-05 MED ORDER — ONDANSETRON HCL 4 MG/2ML IJ SOLN
4.0000 mg | Freq: Once | INTRAMUSCULAR | Status: AC
  Administered 2014-05-05: 4 mg via INTRAVENOUS
  Filled 2014-05-05: qty 2

## 2014-05-05 MED ORDER — HYDROMORPHONE HCL 1 MG/ML IJ SOLN
0.2500 mg | INTRAMUSCULAR | Status: DC | PRN
  Administered 2014-05-05 (×4): 0.5 mg via INTRAVENOUS

## 2014-05-05 MED ORDER — MIDAZOLAM HCL 2 MG/2ML IJ SOLN
INTRAMUSCULAR | Status: AC
  Filled 2014-05-05: qty 2

## 2014-05-05 MED ORDER — KETOROLAC TROMETHAMINE 30 MG/ML IJ SOLN
30.0000 mg | Freq: Once | INTRAMUSCULAR | Status: AC
  Administered 2014-05-05: 30 mg via INTRAVENOUS
  Filled 2014-05-05: qty 1

## 2014-05-05 MED ORDER — LIDOCAINE HCL (CARDIAC) 20 MG/ML IV SOLN
INTRAVENOUS | Status: DC | PRN
  Administered 2014-05-05: 50 mg via INTRAVENOUS

## 2014-05-05 MED ORDER — SODIUM CHLORIDE 0.9 % IV SOLN
INTRAVENOUS | Status: DC
  Administered 2014-05-05 (×2): via INTRAVENOUS

## 2014-05-05 MED ORDER — LACTATED RINGERS IV SOLN: INTRAVENOUS | Status: DC

## 2014-05-05 MED ORDER — 0.9 % SODIUM CHLORIDE (POUR BTL) OPTIME
TOPICAL | Status: DC | PRN
  Administered 2014-05-05: 1000 mL

## 2014-05-05 MED ORDER — HYDROMORPHONE HCL 1 MG/ML IJ SOLN
0.5000 mg | Freq: Once | INTRAMUSCULAR | Status: AC
  Administered 2014-05-05: 0.5 mg via INTRAVENOUS
  Filled 2014-05-05: qty 1

## 2014-05-05 MED ORDER — FENTANYL CITRATE 0.05 MG/ML IJ SOLN
INTRAMUSCULAR | Status: DC | PRN
  Administered 2014-05-05: 100 ug via INTRAVENOUS
  Administered 2014-05-05 (×3): 50 ug via INTRAVENOUS

## 2014-05-05 MED ORDER — FENTANYL CITRATE 0.05 MG/ML IJ SOLN
INTRAMUSCULAR | Status: AC
  Filled 2014-05-05: qty 5

## 2014-05-05 MED ORDER — CEFTRIAXONE SODIUM 1 G IJ SOLR
1.0000 g | Freq: Once | INTRAMUSCULAR | Status: AC
  Administered 2014-05-05: 1 g via INTRAVENOUS
  Filled 2014-05-05: qty 10

## 2014-05-05 MED ORDER — IOHEXOL 300 MG/ML  SOLN
INTRAMUSCULAR | Status: DC | PRN
  Administered 2014-05-05: 10 mL via URETHRAL

## 2014-05-05 MED ORDER — NICOTINE 14 MG/24HR TD PT24
14.0000 mg | MEDICATED_PATCH | Freq: Every day | TRANSDERMAL | Status: DC
  Administered 2014-05-05 – 2014-05-06 (×2): 14 mg via TRANSDERMAL
  Filled 2014-05-05 (×4): qty 1

## 2014-05-05 MED ORDER — HYDROMORPHONE HCL 1 MG/ML IJ SOLN
1.0000 mg | Freq: Once | INTRAMUSCULAR | Status: AC
Start: 2014-05-05 — End: 2014-05-05
  Administered 2014-05-05: 1 mg via INTRAVENOUS
  Filled 2014-05-05: qty 1

## 2014-05-05 MED ORDER — SODIUM CHLORIDE 0.9 % IV BOLUS (SEPSIS)
500.0000 mL | Freq: Once | INTRAVENOUS | Status: AC
  Administered 2014-05-05: 500 mL via INTRAVENOUS

## 2014-05-05 SURGICAL SUPPLY — 19 items
BAG URO CATCHER STRL LF (DRAPE) ×4 IMPLANT
BASKET ZERO TIP NITINOL 2.4FR (BASKET) IMPLANT
CATH INTERMIT  6FR 70CM (CATHETERS) ×4 IMPLANT
FIBER LASER FLEXIVA 1000 (UROLOGICAL SUPPLIES) IMPLANT
FIBER LASER FLEXIVA 200 (UROLOGICAL SUPPLIES) IMPLANT
FIBER LASER FLEXIVA 365 (UROLOGICAL SUPPLIES) IMPLANT
FIBER LASER FLEXIVA 550 (UROLOGICAL SUPPLIES) IMPLANT
FIBER LASER TRAC TIP (UROLOGICAL SUPPLIES) IMPLANT
GLOVE BIOGEL M STRL SZ7.5 (GLOVE) ×4 IMPLANT
GOWN STRL REUS W/TWL LRG LVL3 (GOWN DISPOSABLE) ×4 IMPLANT
GOWN STRL REUS W/TWL XL LVL3 (GOWN DISPOSABLE) ×4 IMPLANT
GUIDEWIRE STR DUAL SENSOR (WIRE) ×4 IMPLANT
MANIFOLD NEPTUNE II (INSTRUMENTS) ×4 IMPLANT
PACK CYSTO (CUSTOM PROCEDURE TRAY) ×4 IMPLANT
SCRUB PCMX 4 OZ (MISCELLANEOUS) IMPLANT
SHIELD EYE BINOCULAR (MISCELLANEOUS) IMPLANT
STENT URET 6FRX24 CONTOUR (STENTS) ×4 IMPLANT
TUBING CONNECTING 10 (TUBING) ×3 IMPLANT
TUBING CONNECTING 10' (TUBING) ×1

## 2014-05-05 NOTE — Transfer of Care (Signed)
Immediate Anesthesia Transfer of Care Note  Patient: Charles Stephens  Procedure(s) Performed: Procedure(s): CYSTOSCOPY/POSSIBLE  RETROGRADE PYELOGRAM/LEFT URETERAL STENT PLACEMENT/POSSIBLE HOLMIUM LASER (Left)  Patient Location: PACU  Anesthesia Type:General  Level of Consciousness: oriented and sedated  Airway & Oxygen Therapy: Patient Spontanous Breathing and Patient connected to face mask oxygen  Post-op Assessment: Report given to RN and Post -op Vital signs reviewed and stable  Post vital signs: Reviewed and stable  Last Vitals:  Filed Vitals:   05/05/14 1739  BP: 129/85  Pulse: 74  Temp: 36.6 C  Resp: 20    Complications: No apparent anesthesia complications

## 2014-05-05 NOTE — H&P (Addendum)
[facility] History and Physical  Tamera StandsMitchell Nicastro ZOX:[id number] DOB: 08-11-1985 DOA: 05/05/2014  Referring physician: ED physician PCP: No PCP Per Patient   Chief Complaint: flank pain   HPI:  29 y.o. male with history of heroin abuse and nephrolithiasis, status [facility] (he does not know the name of the hospital), smoker of 1/2 ppd since age 29, [facility] with main concern of several hours duration of bilateral flank pain but left side worse than right side, sharp and constant, 10/10 in severity, occasionally but not consistently radiating to lower abd quadrants, worse with eating and movement and with no specific alleviating factors. Associated with nausea, one episode of non bloody vomiting, hematuria. Pt denies fevers, chills, dysuria, urinary urgency or frequency.   In ED, pt noted to be in mild distress due to pain and with no significant relief of symptoms with Dilaudid IV. CT abd notable for proximal left 8 x 4mm ureteral stone with mild hydro. UA nitrite positive with many RBCs and bacteria as well as calcium oxalate crystals. WBC and Cr WNL.   Assessment and Plan: Principal Problem:   Nephrolithiasis - with underlying UTI - will admit to medical unit - start with providing supportive care with IVF, analgesia and antiemetics as needed - ABX for UTI, Rocephin  - keep NPO for now and follow up on urology recommendations  Active Problems:   Acute UTI - placed on Rocephin IV - follow up on urine culture - continue IVF as noted above   Transaminitis - ALT > AST - check hep panel, alcohol level - HIV requested as well UDS    Thrombocytopenia - mild in the setting of hematuria - repeat CBC in AM - no indication for transfusion    DVT prophylaxis  - Lovenox SQ  Radiological Exams on Admission: Ct Abdomen Pelvis Wo Contrast  05/05/2014   Bilateral nephrolithiasis. Mild left hydronephrosis secondary to 8 x 4 mm calculus in proximal  left ureter.     Code Status: Full Family Communication: Pt at bedside Disposition Plan: Admit for further evaluation    Danie B[facility]ra [facility] [phone]  Review of Systems:  Constitutional: Negative for fever, chills. Negative for diaphoresis.  HENT: Negative for hearing loss, ear pain, nosebleeds, congestion, sore throat, neck pain, tinnitus and ear discharge.   Eyes: Negative for blurred vision, double vision, photophobia, pain, discharge and redness.  Respiratory: Negative for cough, hemoptysis, sputum production, shortness of breath, wheezing and stridor.   Cardiovascular: Negative for chest pain, palpitations, orthopnea, claudication and leg swelling.  Gastrointestinal: Negative for heartburn, constipation, blood in stool and melena.  Genitourinary: per HPI Musculoskeletal: Negative for myalgias, back pain, joint pain and falls.  Skin: Negative for itching and rash.  Neurological: Negative for dizziness and weakness. Endo/Heme/Allergies: Negative for environmental allergies and polydipsia. Does not bruise/bleed easily.  Psychiatric/Behavioral: Negative for suicidal ideas. The patient is not nervous/anxious.    Past Medical History  Diagnosis Date  . Kidney calculus   . Heroin abuse     Past Surgical History  Procedure Laterality Date  . Kidney stone surgery      Social History:  reports that he has been smoking Cigarettes.  He has been smoking about 1.00 pack per day. He does not have any smokeless tobacco history on file. He reports that he drinks alcohol. He reports that he uses illicit drugs (Marijuana).  No Known Allergies  Pt reports family history of HTN but no heart disease.  Prior to Admission medications   Medication Sig Start Date End Date Taking? Authorizing Provider  diphenhydrAMINE (BENADRYL) 25 MG tablet Take 1 tablet (25 mg total) by mouth every 6 (six) hours. 09/16/12  Yes [name], MD  hydrocortisone cream 1 % Apply to affected area 2 times  daily Patient taking differently: Apply 1 application topically 2 (two) times daily as needed for itching.  09/16/12  Yes [name], MD  naproxen sodium (ANAPROX) 220 MG tablet Take 440 mg by mouth every 6 (six) hours as needed (pain).   Yes Historical Provider, MD  HYDROcodone-acetaminophen (NORCO/VICODIN) 5-325 MG per tablet Take 2 tablets by mouth every 4 (four) hours as needed. 02/21/13   Rolland Porter, MD  HYDROcodone-homatropine [facility]) 5-1.5 MG/5ML syrup Take 5 mLs by mouth every 6 (six) hours as needed for cough. 01/31/13   Kaitlyn Szekalski, PA-C  ondansetron (ZOFRAN ODT) 4 MG disintegrating tablet Take 1 tablet (4 mg total) by mouth every 8 (eight) hours as needed for nausea. 02/21/13   Rolland Porter, MD  promethazine (PHENERGAN) 25 MG tablet Take 1 tablet (25 mg total) by mouth every 6 (six) hours as needed for nausea or vomiting. 01/31/13   Kaitlyn Szekalski, PA-C  tamsulosin (FLOMAX) 0.4 MG CAPS capsule Take 1 capsule (0.4 mg total) by mouth daily. 02/21/13   Rolland Porter, MD    Physical Exam: Filed Vitals:   05/05/14 1030 05/05/14 1035 05/05/14 1320  BP: 129/71 125/89 129/89  Pulse: 69  85  Temp: 97.8 F (36.6 C) 97.6 F (36.4 C) 97.8 F (36.6 C)  TempSrc: Oral Oral Oral  Resp: Height:   (1.753 m)   Weight:  68.04 kg (150 lb)   SpO2: 96% 100% 100%    Physical Exam  Constitutional: Appears well-developed and well-nourished. No distress.  HENT: Normocephalic. External right and left ear normal.Dry MM Eyes: Conjunctivae and EOM are normal. PERRLA, no scleral icterus.  Neck: Normal ROM. Neck supple. No JVD. No tracheal deviation. No thyromegaly.  CVS: RRR, S1/S2 +, no murmurs, no gallops, no carotid bruit.  Pulmonary: Effort and breath sounds normal, no stridor, rhonchi, wheezes, rales.  Abdominal: Soft. BS +,  no distension, tenderness, rebound or guarding. Bilateral flank area tenderness  Musculoskeletal: Normal range of motion. No edema and no tenderness.   Lymphadenopathy: No lymphadenopathy noted, cervical, inguinal. Neuro: Alert. Normal reflexes, muscle tone coordination. No cranial nerve deficit. Skin: Skin is warm and dry. No rash noted. Not diaphoretic. No erythema. No pallor. Tattoos present   Psychiatric: Normal mood and affect. Behavior, judgment, thought content normal.   Labs on Admission:  Basic Metabolic Panel:  Recent Labs Lab 05/05/14 1123  NA 142  K 4.5  CL 111  CO2 23  GLUCOSE 105*  BUN 14  CREATININE 1.01  CALCIUM 8.8   Liver Function Tests:  Recent Labs Lab 05/05/14 1123  AST 106*  ALT 246*  ALKPHOS 70  BILITOT 0.6  PROT 6.7  ALBUMIN 4.1   CBC:  Recent Labs Lab 05/05/14 1123  WBC 8.3  NEUTROABS 5.9  HGB 14.6  HCT 42.0  MCV 90.7  PLT 123*    EKG: pending   If 7PM-7AM, please contact night-coverage [email or web address] Password [facility] 05/05/2014, 3:54 PM

## 2014-05-05 NOTE — Anesthesia Postprocedure Evaluation (Signed)
  Anesthesia Post-op Note  Patient: Tamera StandsMitchell Grillo  Procedure(s) Performed: Procedure(s) (LRB): CYSTOSCOPY/POSSIBLE  RETROGRADE PYELOGRAM/LEFT URETERAL STENT PLACEMENT/POSSIBLE HOLMIUM LASER (Left)  Patient Location: PACU  Anesthesia Type: General  Level of Consciousness: awake and alert   Airway and Oxygen Therapy: Patient Spontanous Breathing  Post-op Pain: mild  Post-op Assessment: Post-op Vital signs reviewed, Patient's Cardiovascular Status Stable, Respiratory Function Stable, Patent Airway and No signs of Nausea or vomiting  Last Vitals:  Filed Vitals:   05/05/14 1930  BP: 123/104  Pulse: 83  Temp:   Resp: 18    Post-op Vital Signs: stable   Complications: No apparent anesthesia complications

## 2014-05-05 NOTE — Anesthesia Preprocedure Evaluation (Addendum)
Anesthesia Evaluation  Patient identified by MRN, date of birth, ID band Patient awake    Reviewed: Allergy & Precautions, H&P , NPO status , Patient's Chart, lab work & pertinent test results  Airway Mallampati: II  TM Distance: >3 FB Neck ROM: full    Dental no notable dental hx. (+) Teeth Intact, Dental Advisory Given   Pulmonary neg pulmonary ROS, Current Smoker,  breath sounds clear to auscultation  Pulmonary exam normal       Cardiovascular Exercise Tolerance: Good negative cardio ROS  Rhythm:regular Rate:Normal     Neuro/Psych negative neurological ROS  negative psych ROS   GI/Hepatic negative GI ROS, (+)     substance abuse  IV drug use,   Endo/Other  negative endocrine ROS  Renal/GU negative Renal ROS  negative genitourinary   Musculoskeletal   Abdominal   Peds  Hematology negative hematology ROS (+) Thrombocytopenia 120K   Anesthesia Other Findings   Reproductive/Obstetrics negative OB ROS                            Anesthesia Physical Anesthesia Plan  ASA: II  Anesthesia Plan: General   Post-op Pain Management:    Induction: Intravenous  Airway Management Planned: LMA  Additional Equipment:   Intra-op Plan:   Post-operative Plan:   Informed Consent: I have reviewed the patients History and Physical, chart, labs and discussed the procedure including the risks, benefits and alternatives for the proposed anesthesia with the patient or authorized representative who has indicated his/her understanding and acceptance.   Dental Advisory Given  Plan Discussed with: CRNA and Surgeon  Anesthesia Plan Comments:         Anesthesia Quick Evaluation

## 2014-05-05 NOTE — Op Note (Signed)
Pre-operative diagnosis :   Left mid ureteral stone with colic ahd hydronephrosis, and pyelonephritis  Postoperative diagnosis:  Same  Operation:  Cystoscopy, Left retrograde pyelogram, with interpretation, Left JJ stent placement ( 55F x 24 cm).   Surgeon:  Kathie RhodesS. Patsi Searsannenbaum, MD  First assistant:  none  Anesthesia:  General LMA  Preparation:  After appropriate preanesthesia, the patient was brought the operating, placed on the operating table in the dorsal supine position and general LMA anesthesia was introduced. It was replaced in the dorsal lithotomy position where the pubis was prepped with Betadine solution and draped in usual fashion. The armband was double checked.  Review history:   History of Present Illness: Charles Stephens is a 29 y.o. male with PMH significant for heroin abuse and nephrolithiasis who presented to the ED today with c/o left flank pain and gross hematuria. Pt states left flank pain started at 4 am today and progressed in severity causing him to call 911 around 8 am. The pain does not radiate. He denies fevers and chills. He did have one episode of vomiting prior to coming to the hospital. He denies dysuria and difficulty voiding. CT scan shows proximal left 8 x 4mm ureteral stone with mild hydro. UA is nitrite positive with many RBCs and bacteria as well as calcium oxalate crystals. WBC and CR are WNL. He states his pain is currently 8/10 and denies nausea.   He states he has been passing stones since age 29. He had stent placement and stone removal [place] a [place] last [place]. He has not been evaled by a urologist for for stone makeup or preventative measures. He has a hx of drug abuse but states that he has not used any drugs for several years. He smokes 1/2ppd.   Statement of  Likelihood of Success: Excellent. TIME-OUT observed.:  Procedure:  Cystourethroscopy was accomplished after urethral meatal dilation to a size 1928 [place] with the male sounds.  The cystoscope was placed, and the bladder neck appeared to be slightly elevated. The bladder itself was normal, with no evidence of trabeculation or cellule formation. There was no bladder stone or tumor formation or bladder diverticular formation. The trigone was normal, with normal ureteral orifices. Left [place] pyelogram was performed which showed obstruction of the left ureter in the mid ureteral portion. A guidewire was passed around the stone and coiled in the renal pelvis. Over this guidewire, a 6 [place] by 24 7 m double-J stent was placed without difficulty. The patient received IV Tylenol and IV Toradol, as well as IV antibiotic. He was awakened after Xylocaine jelly placed per urethra, and taken to recovery room in good condition with an empty bladder.

## 2014-05-05 NOTE — H&P (View-Only) (Signed)
Urology Consult   Physician requesting consult: Zammit  Reason for consult: Left ureteral stone with mild hydronephrosis  History of Present Illness: Charles Stephens is a 29 y.o. male with PMH significant for heroin abuse and nephrolithiasis who presented to the ED today with c/o left flank pain and gross hematuria.  Pt states left flank pain started at 4 am today and progressed in severity causing him to call 911 around 8 am.  The pain does not radiate.  He denies fevers and chills.  He did have one episode of vomiting prior to coming to the hospital.  He denies dysuria and difficulty voiding.  CT scan shows proximal left 8 x 4mm ureteral stone with mild hydro.  UA is nitrite positive with many RBCs and bacteria as well as calcium oxalate crystals. WBC and CR are WNL.  He states his pain is currently 8/10 and denies nausea.   He states he has been passing stones since age 29. He had stent placement and stone removal [place] hospital [place] last [place].  He has not been evaled by a urologist for for stone makeup or preventative measures. He has a hx of drug abuse but states that he has not used any drugs for several years.  He smokes 1/2ppd.     He denies a history of voiding or storage urinary symptoms, UTIs, and GU malignancy/trauma.  Past Medical History  Diagnosis Date  . Kidney calculus   . Heroin abuse     Past Surgical History  Procedure Laterality Date  . Kidney stone surgery    cosmetic nasal surgery  Current Hospital Medications:  Home Meds:    Medication List    ASK your doctor about these medications        diphenhydrAMINE 25 MG tablet  Commonly known as:  BENADRYL  Take 1 tablet (25 mg total) by mouth every 6 (six) hours.     HYDROcodone-acetaminophen 5-325 MG per tablet  Commonly known as:  NORCO/VICODIN  Take 2 tablets by mouth every 4 (four) hours as needed.     HYDROcodone-homatropine 5-1.5 MG/5ML syrup  Commonly known as:  HYCODAN  Take 5 mLs by mouth  every 6 (six) hours as needed for cough.     hydrocortisone cream 1 %  Apply to affected area 2 times daily     naproxen sodium 220 MG tablet  Commonly known as:  ANAPROX  Take 440 mg by mouth every 6 (six) hours as needed (pain).     ondansetron 4 MG disintegrating tablet  Commonly known as:  ZOFRAN ODT  Take 1 tablet (4 mg total) by mouth every 8 (eight) hours as needed for nausea.     promethazine 25 MG tablet  Commonly known as:  PHENERGAN  Take 1 tablet (25 mg total) by mouth every 6 (six) hours as needed for nausea or vomiting.     tamsulosin 0.4 MG Caps capsule  Commonly known as:  FLOMAX  Take 1 capsule (0.4 mg total) by mouth daily.        Scheduled Meds: Continuous Infusions: PRN Meds:.  Allergies: No Known Allergies  History reviewed. No pertinent family history.  Social History:  reports that he has been smoking Cigarettes.  He has been smoking about 1.00 pack per day. He does not have any smokeless tobacco history on file. He reports that he drinks alcohol. He reports that he uses illicit drugs (Marijuana).  ROS: A complete review of systems was performed.  All systems are  negative except for pertinent findings as noted.  Physical Exam:  Vital signs in last 24 hours: Temp:  [97.6 F (36.4 C)-97.8 F (36.6 C)] 97.8 F (36.6 C) (04/12 1320) Pulse Rate:  [69-85] 85 (04/12 1320) Resp:  [18-22] 22 (04/12 1320) BP: (125-129)/(71-89) 129/89 mmHg (04/12 1320) SpO2:  [96 %-100 %] 100 % (04/12 1320) Weight:  [68.04 kg (150 lb)] 68.04 kg (150 lb) (04/12 1035) Constitutional:  Alert and oriented, No acute distress Cardiovascular: Regular rate and rhythm Respiratory: Normal respiratory effort GI: Abdomen is soft, nontender, nondistended, no abdominal masses GU: left CVA tenderness Lymphatic: No lymphadenopathy Neurologic: Grossly intact, no focal deficits Psychiatric: Normal mood and affect  Laboratory Data:   Recent Labs  05/05/14 1123  WBC 8.3  HGB  14.6  HCT 42.0  PLT 123*     Recent Labs  05/05/14 1123  NA 142  K 4.5  CL 111  GLUCOSE 105*  BUN 14  CALCIUM 8.8  CREATININE 1.01     Results for orders placed or performed during the hospital encounter of 05/05/14 (from the past 24 hour(s))  CBC with Differential/Platelet     Status: Abnormal   Collection Time: 05/05/14 11:23 AM  Result Value Ref Range   WBC 8.3 4.0 - 10.5 K/uL   RBC 4.63 4.22 - 5.81 MIL/uL   Hemoglobin 14.6 13.0 - 17.0 g/dL   HCT 16.1 09.6 - 04.5 %   MCV 90.7 78.0 - 100.0 fL   MCH 31.5 26.0 - 34.0 pg   MCHC 34.8 30.0 - 36.0 g/dL   RDW 40.9 81.1 - 91.4 %   Platelets 123 (L) 150 - 400 K/uL   Neutrophils Relative % 72 43 - 77 %   Neutro Abs 5.9 1.7 - 7.7 K/uL   Lymphocytes Relative 19 12 - 46 %   Lymphs Abs 1.6 0.7 - 4.0 K/uL   Monocytes Relative 8 3 - 12 %   Monocytes Absolute 0.7 0.1 - 1.0 K/uL   Eosinophils Relative 1 0 - 5 %   Eosinophils Absolute 0.1 0.0 - 0.7 K/uL   Basophils Relative 0 0 - 1 %   Basophils Absolute 0.0 0.0 - 0.1 K/uL  Comprehensive metabolic panel     Status: Abnormal   Collection Time: 05/05/14 11:23 AM  Result Value Ref Range   Sodium 142 135 - 145 mmol/L   Potassium 4.5 3.5 - 5.1 mmol/L   Chloride 111 96 - 112 mmol/L   CO2 23 19 - 32 mmol/L   Glucose, Bld 105 (H) 70 - 99 mg/dL   BUN 14 6 - 23 mg/dL   Creatinine, Ser 7.82 0.50 - 1.35 mg/dL   Calcium 8.8 8.4 - 95.6 mg/dL   Total Protein 6.7 6.0 - 8.3 g/dL   Albumin 4.1 3.5 - 5.2 g/dL   AST 213 (H) 0 - 37 U/L   ALT 246 (H) 0 - 53 U/L   Alkaline Phosphatase 70 39 - 117 U/L   Total Bilirubin 0.6 0.3 - 1.2 mg/dL   GFR calc non Af Amer >90 >90 mL/min   GFR calc Af Amer >90 >90 mL/min   Anion gap 8 5 - 15  Urinalysis, Routine w reflex microscopic     Status: Abnormal   Collection Time: 05/05/14 12:29 PM  Result Value Ref Range   Color, Urine RED (A) YELLOW   APPearance TURBID (A) CLEAR   Specific Gravity, Urine 1.029 1.005 - 1.030   pH 5.5 5.0 - 8.0  Glucose, UA  NEGATIVE NEGATIVE mg/dL   Hgb urine dipstick LARGE (A) NEGATIVE   Bilirubin Urine MODERATE (A) NEGATIVE   Ketones, ur NEGATIVE NEGATIVE mg/dL   Protein, ur 161 (A) NEGATIVE mg/dL   Urobilinogen, UA 1.0 0.0 - 1.0 mg/dL   Nitrite POSITIVE (A) NEGATIVE   Leukocytes, UA SMALL (A) NEGATIVE  Urine microscopic-add on     Status: Abnormal   Collection Time: 05/05/14 12:29 PM  Result Value Ref Range   WBC, UA 7-10 <3 WBC/hpf   RBC / HPF TOO NUMEROUS TO COUNT <3 RBC/hpf   Bacteria, UA MANY (A) RARE   Crystals CA OXALATE CRYSTALS (A) NEGATIVE   Urine-Other MUCOUS PRESENT    No results found for this or any previous visit (from the past 240 hour(s)).  Renal Function:  Recent Labs  05/05/14 1123  CREATININE 1.01   Estimated Creatinine Clearance: 105.7 mL/min (by C-G formula based on Cr of 1.01).  Radiologic Imaging: Ct Abdomen Pelvis Wo Contrast  05/05/2014   CLINICAL DATA:  Acute right flank pain.  Gross hematuria.  EXAM: CT ABDOMEN AND PELVIS WITHOUT CONTRAST  TECHNIQUE: Multidetector CT imaging of the abdomen and pelvis was performed following the standard protocol without IV contrast.  COMPARISON:  CT scan of April 06, 2011.  FINDINGS: Visualized lung bases appear normal. No significant adenopathy is noted.  No gallstones are noted. No focal abnormality is noted in the liver, spleen or pancreas. Adrenal glands appear normal. Bilateral nephrolithiasis is noted. Mild left hydronephrosis is noted secondary to 8 x 4 mm calculus in proximal left ureter. The appendix appears normal. There is no evidence of bowel obstruction. No abnormal fluid collection is noted. Urinary bladder is decompressed. No significant adenopathy is noted.  IMPRESSION: Bilateral nephrolithiasis. Mild left hydronephrosis secondary to 8 x 4 mm calculus in proximal left ureter.   Electronically Signed   By: Lupita Raider, M.D.   On: 05/05/2014 13:05     Impression/Recommendation  UTI--send urine for culture.  Pt received  one dose of Rocephin in the ED.  He will need to complete a 7 day course of oral ABx therapy (Bactrim or Cipro are appropriate).  F/u culture.  Repeat UA after completion of Abx to ensure resolution.   Left ureteral stone with hydro--will schedule pt today for stent placement by Dr. Patsi Sears to ensure proper urine drainage in setting of infection.  IM to admit pt overnight post procedure as pt does not have transportation or anyone to care for him after he goes home.  Continue pain control and ABx therapy.    Pt will need to go home with Abx, pain medication, Flomax, and anticholinergic.  He will f/u with Dr. Patsi Sears in approx 1 week to reassess for possible stone tx (if not passed) vs stent removal.  Epifania Littrell 05/05/2014, 3:44 PM

## 2014-05-05 NOTE — Consult Note (Signed)
Urology Consult   Physician requesting consult: Zammit  Reason for consult: Left ureteral stone with mild hydronephrosis  History of Present Illness: Charles Stephens is a 28 y.o. male with PMH significant for heroin abuse and nephrolithiasis who presented to the ED today with c/o left flank pain and gross hematuria.  Pt states left flank pain started at 4 am today and progressed in severity causing him to call 911 around 8 am.  The pain does not radiate.  He denies fevers and chills.  He did have one episode of vomiting prior to coming to the hospital.  He denies dysuria and difficulty voiding.  CT scan shows proximal left 8 x 4mm ureteral stone with mild hydro.  UA is nitrite positive with many RBCs and bacteria as well as calcium oxalate crystals. WBC and CR are WNL.  He states his pain is currently 8/10 and denies nausea.   He states he has been passing stones since age 18. He had stent placement and stone removal "at a hospital in [place]" last year.  He has not been evaled by a urologist for for stone makeup or preventative measures. He has a hx of drug abuse but states that he has not used any drugs for several years.  He smokes 1/2ppd.     He denies a history of voiding or storage urinary symptoms, UTIs, and GU malignancy/trauma.  Past Medical History  Diagnosis Date  . Kidney calculus   . Heroin abuse     Past Surgical History  Procedure Laterality Date  . Kidney stone surgery    cosmetic nasal surgery  Current Hospital Medications:  Home Meds:    Medication List    ASK your doctor about these medications        diphenhydrAMINE 25 MG tablet  Commonly known as:  BENADRYL  Take 1 tablet (25 mg total) by mouth every 6 (six) hours.     HYDROcodone-acetaminophen 5-325 MG per tablet  Commonly known as:  NORCO/VICODIN  Take 2 tablets by mouth every 4 (four) hours as needed.     HYDROcodone-homatropine 5-1.5 MG/5ML syrup  Commonly known as:  HYCODAN  Take 5 mLs by mouth  every 6 (six) hours as needed for cough.     hydrocortisone cream 1 %  Apply to affected area 2 times daily     naproxen sodium 220 MG tablet  Commonly known as:  ANAPROX  Take 440 mg by mouth every 6 (six) hours as needed (pain).     ondansetron 4 MG disintegrating tablet  Commonly known as:  ZOFRAN ODT  Take 1 tablet (4 mg total) by mouth every 8 (eight) hours as needed for nausea.     promethazine 25 MG tablet  Commonly known as:  PHENERGAN  Take 1 tablet (25 mg total) by mouth every 6 (six) hours as needed for nausea or vomiting.     tamsulosin 0.4 MG Caps capsule  Commonly known as:  FLOMAX  Take 1 capsule (0.4 mg total) by mouth daily.        Scheduled Meds: Continuous Infusions: PRN Meds:.  Allergies: No Known Allergies  History reviewed. No pertinent family history.  Social History:  reports that he has been smoking Cigarettes.  He has been smoking about 1.00 pack per day. He does not have any smokeless tobacco history on file. He reports that he drinks alcohol. He reports that he uses illicit drugs (Marijuana).  ROS: A complete review of systems was performed.  All systems are   negative except for pertinent findings as noted.  Physical Exam:  Vital signs in last 24 hours: Temp:  [97.6 F (36.4 C)-97.8 F (36.6 C)] 97.8 F (36.6 C) (04/12 1320) Pulse Rate:  [69-85] 85 (04/12 1320) Resp:  [18-22] 22 (04/12 1320) BP: (125-129)/(71-89) 129/89 mmHg (04/12 1320) SpO2:  [96 %-100 %] 100 % (04/12 1320) Weight:  [68.04 kg (150 lb)] 68.04 kg (150 lb) (04/12 1035) Constitutional:  Alert and oriented, No acute distress Cardiovascular: Regular rate and rhythm Respiratory: Normal respiratory effort GI: Abdomen is soft, nontender, nondistended, no abdominal masses GU: left CVA tenderness Lymphatic: No lymphadenopathy Neurologic: Grossly intact, no focal deficits Psychiatric: Normal mood and affect  Laboratory Data:   Recent Labs  05/05/14 1123  WBC 8.3  HGB  14.6  HCT 42.0  PLT 123*     Recent Labs  05/05/14 1123  NA 142  K 4.5  CL 111  GLUCOSE 105*  BUN 14  CALCIUM 8.8  CREATININE 1.01     Results for orders placed or performed during the hospital encounter of 05/05/14 (from the past 24 hour(s))  CBC with Differential/Platelet     Status: Abnormal   Collection Time: 05/05/14 11:23 AM  Result Value Ref Range   WBC 8.3 4.0 - 10.5 K/uL   RBC 4.63 4.22 - 5.81 MIL/uL   Hemoglobin 14.6 13.0 - 17.0 g/dL   HCT 42.0 39.0 - 52.0 %   MCV 90.7 78.0 - 100.0 fL   MCH 31.5 26.0 - 34.0 pg   MCHC 34.8 30.0 - 36.0 g/dL   RDW 13.5 11.5 - 15.5 %   Platelets 123 (L) 150 - 400 K/uL   Neutrophils Relative % 72 43 - 77 %   Neutro Abs 5.9 1.7 - 7.7 K/uL   Lymphocytes Relative 19 12 - 46 %   Lymphs Abs 1.6 0.7 - 4.0 K/uL   Monocytes Relative 8 3 - 12 %   Monocytes Absolute 0.7 0.1 - 1.0 K/uL   Eosinophils Relative 1 0 - 5 %   Eosinophils Absolute 0.1 0.0 - 0.7 K/uL   Basophils Relative 0 0 - 1 %   Basophils Absolute 0.0 0.0 - 0.1 K/uL  Comprehensive metabolic panel     Status: Abnormal   Collection Time: 05/05/14 11:23 AM  Result Value Ref Range   Sodium 142 135 - 145 mmol/L   Potassium 4.5 3.5 - 5.1 mmol/L   Chloride 111 96 - 112 mmol/L   CO2 23 19 - 32 mmol/L   Glucose, Bld 105 (H) 70 - 99 mg/dL   BUN 14 6 - 23 mg/dL   Creatinine, Ser 1.01 0.50 - 1.35 mg/dL   Calcium 8.8 8.4 - 10.5 mg/dL   Total Protein 6.7 6.0 - 8.3 g/dL   Albumin 4.1 3.5 - 5.2 g/dL   AST 106 (H) 0 - 37 U/L   ALT 246 (H) 0 - 53 U/L   Alkaline Phosphatase 70 39 - 117 U/L   Total Bilirubin 0.6 0.3 - 1.2 mg/dL   GFR calc non Af Amer >90 >90 mL/min   GFR calc Af Amer >90 >90 mL/min   Anion gap 8 5 - 15  Urinalysis, Routine w reflex microscopic     Status: Abnormal   Collection Time: 05/05/14 12:29 PM  Result Value Ref Range   Color, Urine RED (A) YELLOW   APPearance TURBID (A) CLEAR   Specific Gravity, Urine 1.029 1.005 - 1.030   pH 5.5 5.0 - 8.0     Glucose, UA  NEGATIVE NEGATIVE mg/dL   Hgb urine dipstick LARGE (A) NEGATIVE   Bilirubin Urine MODERATE (A) NEGATIVE   Ketones, ur NEGATIVE NEGATIVE mg/dL   Protein, ur 100 (A) NEGATIVE mg/dL   Urobilinogen, UA 1.0 0.0 - 1.0 mg/dL   Nitrite POSITIVE (A) NEGATIVE   Leukocytes, UA SMALL (A) NEGATIVE  Urine microscopic-add on     Status: Abnormal   Collection Time: 05/05/14 12:29 PM  Result Value Ref Range   WBC, UA 7-10 <3 WBC/hpf   RBC / HPF TOO NUMEROUS TO COUNT <3 RBC/hpf   Bacteria, UA MANY (A) RARE   Crystals CA OXALATE CRYSTALS (A) NEGATIVE   Urine-Other MUCOUS PRESENT    No results found for this or any previous visit (from the past 240 hour(s)).  Renal Function:  Recent Labs  05/05/14 1123  CREATININE 1.01   Estimated Creatinine Clearance: 105.7 mL/min (by C-G formula based on Cr of 1.01).  Radiologic Imaging: Ct Abdomen Pelvis Wo Contrast  05/05/2014   CLINICAL DATA:  Acute right flank pain.  Gross hematuria.  EXAM: CT ABDOMEN AND PELVIS WITHOUT CONTRAST  TECHNIQUE: Multidetector CT imaging of the abdomen and pelvis was performed following the standard protocol without IV contrast.  COMPARISON:  CT scan of April 06, 2011.  FINDINGS: Visualized lung bases appear normal. No significant adenopathy is noted.  No gallstones are noted. No focal abnormality is noted in the liver, spleen or pancreas. Adrenal glands appear normal. Bilateral nephrolithiasis is noted. Mild left hydronephrosis is noted secondary to 8 x 4 mm calculus in proximal left ureter. The appendix appears normal. There is no evidence of bowel obstruction. No abnormal fluid collection is noted. Urinary bladder is decompressed. No significant adenopathy is noted.  IMPRESSION: Bilateral nephrolithiasis. Mild left hydronephrosis secondary to 8 x 4 mm calculus in proximal left ureter.   Electronically Signed   By: James  Green Jr, M.D.   On: 05/05/2014 13:05     Impression/Recommendation  UTI--send urine for culture.  Pt received  one dose of Rocephin in the ED.  He will need to complete a 7 day course of oral ABx therapy (Bactrim or Cipro are appropriate).  F/u culture.  Repeat UA after completion of Abx to ensure resolution.   Left ureteral stone with hydro--will schedule pt today for stent placement by Dr. Tannenbaum to ensure proper urine drainage in setting of infection.  IM to admit pt overnight post procedure as pt does not have transportation or anyone to care for him after he goes home.  Continue pain control and ABx therapy.    Pt will need to go home with Abx, pain medication, Flomax, and anticholinergic.  He will f/u with Dr. Tannenbaum in approx 1 week to reassess for possible stone tx (if not passed) vs stent removal.  Charles Stephens 05/05/2014, 3:44 PM   

## 2014-05-05 NOTE — Anesthesia Procedure Notes (Signed)
Procedure Name: LMA Insertion Date/Time: 05/05/2014 6:27 PM Performed by: Enriqueta ShutterWILLIFORD, Megon Kalina D Pre-anesthesia Checklist: Patient being monitored, Timeout performed, Patient identified, Emergency Drugs available and Suction available Oxygen Delivery Method: Circle system utilized Preoxygenation: Pre-oxygenation with 100% oxygen Intubation Type: IV induction Ventilation: Mask ventilation without difficulty LMA: LMA inserted LMA Size: 4.0 Tube type: Oral Number of attempts: 1

## 2014-05-05 NOTE — Interval H&P Note (Signed)
History and Physical Interval Note:  05/05/2014 6:15 PM  Charles Stephens  has presented today for surgery, with the diagnosis of LEFT URETERAL CALCULI  The various methods of treatment have been discussed with the patient and family. After consideration of risks, benefits and other options for treatment, the patient has consented to  Procedure(s): CYSTOSCOPY/POSSIBLE  RETROGRADE PYELOGRAM/LEFT URETERAL STENT PLACEMENT/POSSIBLE HOLMIUM LASER (Left) HOLMIUM LASER APPLICATION (Left) as a surgical intervention .  The patient's history has been reviewed, patient examined, no change in status, stable for surgery.  I have reviewed the patient's chart and labs.  Questions were answered to the patient's satisfaction.     [name] [name]

## 2014-05-05 NOTE — ED Notes (Signed)
Per EMS pt contacted has had left flank pain since 4 am, pt reports hx kidney stones in the past.  Fentanyl 250 mcg left hand 20G. NSR, pain has decreased from 10/10 down 6/10.

## 2014-05-05 NOTE — ED Notes (Signed)
Bed: WA09 Expected date:  Expected time:  Means of arrival:  Comments: EMS- kidney stones, IV established

## 2014-05-05 NOTE — ED Provider Notes (Signed)
CSN: [id number]     Arrival date & time 05/05/14  1028 History   First MD Initiated Contact with Patient 05/05/14 1047     Chief Complaint  Patient presents with  . Flank Pain    left     (Consider location/radiation/quality/duration/timing/severity/associated sxs/prior Treatment) Patient is a 29 y.o. male presenting with flank pain. The history is provided by the patient (pt complains of left flank pain).  Flank Pain This is a new problem. The current episode started 1 to 2 hours ago. The problem occurs constantly. The problem has not changed since onset.Associated symptoms include abdominal pain. Pertinent negatives include no chest pain and no headaches. Nothing aggravates the symptoms. Nothing relieves the symptoms. He has tried nothing for the symptoms. The treatment provided no relief.    Past Medical History  Diagnosis Date  . Kidney calculus   . Heroin abuse    Past Surgical History  Procedure Laterality Date  . Kidney stone surgery     History reviewed. No pertinent family history. History  Substance Use Topics  . Smoking status: Current Every Day Smoker -- 1.00 packs/day    Types: Cigarettes  . Smokeless tobacco: Not on file  . Alcohol Use: Yes     Comment: occassion    Review of Systems  Constitutional: Negative for appetite change and fatigue.  HENT: Negative for congestion, ear discharge and sinus pressure.   Eyes: Negative for discharge.  Respiratory: Negative for cough.   Cardiovascular: Negative for chest pain.  Gastrointestinal: Positive for abdominal pain. Negative for diarrhea.  Genitourinary: Positive for flank pain. Negative for frequency and hematuria.  Musculoskeletal: Negative for back pain.  Skin: Negative for rash.  Neurological: Negative for seizures and headaches.  Psychiatric/Behavioral: Negative for hallucinations.      Allergies  Review of patient's allergies indicates no known allergies.  Home Medications   Prior to Admission  medications   Medication Sig Start Date End Date Taking? Authorizing Provider  diphenhydrAMINE (BENADRYL) 25 MG tablet Take 1 tablet (25 mg total) by mouth every 6 (six) hours. 09/16/12  Yes [name], MD  hydrocortisone cream 1 % Apply to affected area 2 times daily Patient taking differently: Apply 1 application topically 2 (two) times daily as needed for itching.  09/16/12  Yes [name], MD  naproxen sodium (ANAPROX) 220 MG tablet Take 440 mg by mouth every 6 (six) hours as needed (pain).   Yes Historical Provider, MD  HYDROcodone-acetaminophen (NORCO/VICODIN) 5-325 MG per tablet Take 2 tablets by mouth every 4 (four) hours as needed. 02/21/13   Rolland Porter, MD  HYDROcodone-homatropine [facility]) 5-1.5 MG/5ML syrup Take 5 mLs by mouth every 6 (six) hours as needed for cough. 01/31/13   Kaitlyn Szekalski, PA-C  ondansetron (ZOFRAN ODT) 4 MG disintegrating tablet Take 1 tablet (4 mg total) by mouth every 8 (eight) hours as needed for nausea. 02/21/13   Rolland Porter, MD  promethazine (PHENERGAN) 25 MG tablet Take 1 tablet (25 mg total) by mouth every 6 (six) hours as needed for nausea or vomiting. 01/31/13   Kaitlyn Szekalski, PA-C  tamsulosin (FLOMAX) 0.4 MG CAPS capsule Take 1 capsule (0.4 mg total) by mouth daily. 02/21/13   Rolland Porter, MD   BP 129/89 mmHg  Pulse 85  Temp(Src) 97.8 F (36.6 C) (Oral)  Resp 22  Ht  (1.753 m)  Wt 150 lb (68.04 kg)  BMI 22.14 kg/m2  SpO2 100% Physical Exam  Constitutional: He is oriented to person, place, and time.  He appears well-developed.  HENT:  Head: Normocephalic.  Eyes: Conjunctivae and EOM are normal. No scleral icterus.  Neck: Neck supple. No thyromegaly present.  Cardiovascular: Normal rate and regular rhythm.  Exam reveals no gallop and no friction rub.   No murmur heard. Pulmonary/Chest: No stridor. He has no wheezes. He has no rales. He exhibits no tenderness.  Abdominal: He exhibits no distension. There is no tenderness. There is no  rebound.  Genitourinary:  Tender left flank  Musculoskeletal: Normal range of motion. He exhibits no edema.  Lymphadenopathy:    He has no cervical adenopathy.  Neurological: He is oriented to person, place, and time. He exhibits normal muscle tone. Coordination normal.  Skin: No rash noted. No erythema.  Psychiatric: He has a normal mood and affect. His behavior is normal.    ED Course  Procedures (including critical care time) Labs Review Labs Reviewed  CBC WITH DIFFERENTIAL/PLATELET - Abnormal; Notable for the following:    Platelets 123 (*)    All other components within normal limits  COMPREHENSIVE METABOLIC PANEL - Abnormal; Notable for the following:    Glucose, Bld 105 (*)    AST 106 (*)    ALT 246 (*)    All other components within normal limits  URINALYSIS, ROUTINE W REFLEX MICROSCOPIC - Abnormal; Notable for the following:    Color, Urine RED (*)    APPearance TURBID (*)    Hgb urine dipstick LARGE (*)    Bilirubin Urine MODERATE (*)    Protein, ur 100 (*)    Nitrite POSITIVE (*)    Leukocytes, UA SMALL (*)    All other components within normal limits  URINE MICROSCOPIC-ADD ON - Abnormal; Notable for the following:    Bacteria, UA MANY (*)    Crystals CA OXALATE CRYSTALS (*)    All other components within normal limits  URINE CULTURE    Imaging Review Ct Abdomen Pelvis Wo Contrast  05/05/2014   CLINICAL DATA:  Acute right flank pain.  Gross hematuria.  EXAM: CT ABDOMEN AND PELVIS WITHOUT CONTRAST  TECHNIQUE: Multidetector CT imaging of the abdomen and pelvis was performed following the standard protocol without IV contrast.  COMPARISON:  CT scan of April 06, 2011.  FINDINGS: Visualized lung bases appear normal. No significant adenopathy is noted.  No gallstones are noted. No focal abnormality is noted in the liver, spleen or pancreas. Adrenal glands appear normal. Bilateral nephrolithiasis is noted. Mild left hydronephrosis is noted secondary to 8 x 4 mm calculus  in proximal left ureter. The appendix appears normal. There is no evidence of bowel obstruction. No abnormal fluid collection is noted. Urinary bladder is decompressed. No significant adenopathy is noted.  IMPRESSION: Bilateral nephrolithiasis. Mild left hydronephrosis secondary to 8 x 4 mm calculus in proximal left ureter.   Electronically Signed   By: Lupita RaiderJames  Green Jr, M.D.   On: 05/05/2014 13:05     EKG Interpretation None      MDM   Final diagnoses:  Pain  Kidney stone    Kidney stone,   admit    Bethann BerkshireJoseph Febe Champa, MD 05/05/14 1555

## 2014-05-06 ENCOUNTER — Encounter (HOSPITAL_COMMUNITY): Payer: Self-pay | Admitting: Urology

## 2014-05-06 DIAGNOSIS — F1910 Other psychoactive substance abuse, uncomplicated: Secondary | ICD-10-CM

## 2014-05-06 LAB — CBC
HCT: 42.2 % (ref 39.0–52.0)
Hemoglobin: 14.3 g/dL (ref 13.0–17.0)
MCH: 31.5 pg (ref 26.0–34.0)
MCHC: 33.9 g/dL (ref 30.0–36.0)
MCV: 93 fL (ref 78.0–100.0)
Platelets: 110 10*3/uL — ABNORMAL LOW (ref 150–400)
RBC: 4.54 MIL/uL (ref 4.22–5.81)
RDW: 13.8 % (ref 11.5–15.5)
WBC: 7.4 10*3/uL (ref 4.0–10.5)

## 2014-05-06 LAB — URINE CULTURE
COLONY COUNT: NO GROWTH
Culture: NO GROWTH

## 2014-05-06 LAB — HEPATIC FUNCTION PANEL
ALK PHOS: 63 U/L (ref 39–117)
ALT: 223 U/L — ABNORMAL HIGH (ref 0–53)
AST: 95 U/L — AB (ref 0–37)
Albumin: 3.6 g/dL (ref 3.5–5.2)
BILIRUBIN DIRECT: 0.2 mg/dL (ref 0.0–0.5)
BILIRUBIN TOTAL: 0.4 mg/dL (ref 0.3–1.2)
Indirect Bilirubin: 0.2 mg/dL — ABNORMAL LOW (ref 0.3–0.9)
Total Protein: 6.4 g/dL (ref 6.0–8.3)

## 2014-05-06 LAB — BASIC METABOLIC PANEL
ANION GAP: 7 (ref 5–15)
BUN: 14 mg/dL (ref 6–23)
CALCIUM: 8.5 mg/dL (ref 8.4–10.5)
CO2: 23 mmol/L (ref 19–32)
CREATININE: 1.18 mg/dL (ref 0.50–1.35)
Chloride: 111 mmol/L (ref 96–112)
GFR calc non Af Amer: 83 mL/min — ABNORMAL LOW (ref >90)
Glucose, Bld: 81 mg/dL (ref 70–99)
Potassium: 4.6 mmol/L (ref 3.5–5.1)
SODIUM: 141 mmol/L (ref 135–145)

## 2014-05-06 LAB — HIV ANTIBODY (ROUTINE TESTING W REFLEX): HIV Screen 4th Generation wRfx: NONREACTIVE

## 2014-05-06 MED ORDER — OXYBUTYNIN CHLORIDE 5 MG PO TABS
5.0000 mg | ORAL_TABLET | Freq: Three times a day (TID) | ORAL | Status: DC | PRN
  Administered 2014-05-06: 5 mg via ORAL
  Filled 2014-05-06: qty 1

## 2014-05-06 MED ORDER — TAMSULOSIN HCL 0.4 MG PO CAPS
0.4000 mg | ORAL_CAPSULE | Freq: Every day | ORAL | Status: DC
  Administered 2014-05-06: 0.4 mg via ORAL
  Filled 2014-05-06 (×2): qty 1

## 2014-05-06 NOTE — Progress Notes (Signed)
1 Day Post-Op Subjective: NAEON. Pain improved but still with some soreness in his back and testicles. No nausea/vomiting, no fevers/chills.   Objective: Vital signs in last 24 hours: Temp:  [97.4 F (36.3 C)-98.2 F (36.8 C)] 97.4 F (36.3 C) (04/13 0616) Pulse Rate:  [57-88] 62 (04/13 0616) Resp:  [11-23] 15 (04/13 0616) BP: (96-139)/(50-104) 96/50 mmHg (04/13 0616) SpO2:  [92 %-100 %] 92 % (04/13 0616) Weight:  [150 lb (68.04 kg)] 150 lb (68.04 kg) (04/12 1739)  Intake/Output from previous day: 04/12 0701 - 04/13 0700 In: 2346.7 [I.V.:2346.7] Out: 125 [Urine:125] Intake/Output this shift:    Physical Exam:  General: Alert and oriented CV: RRR Lungs: Clear Abdomen: Soft, ND Ext: NT, No erythema  Lab Results:  Recent Labs  05/05/14 1123 05/06/14 0350  HGB 14.6 14.3  HCT 42.0 42.2   BMET  Recent Labs  05/05/14 1123 05/06/14 0350  NA 142 141  K 4.5 4.6  CL 111 111  CO2 23 23  GLUCOSE 105* 81  BUN 14 14  CREATININE 1.01 1.18  CALCIUM 8.8 8.5   Urinalysis    Component Value Date/Time   COLORURINE RED* 05/05/2014 1229   APPEARANCEUR TURBID* 05/05/2014 1229   LABSPEC 1.029 05/05/2014 1229   PHURINE 5.5 05/05/2014 1229   GLUCOSEU NEGATIVE 05/05/2014 1229   HGBUR LARGE* 05/05/2014 1229   BILIRUBINUR MODERATE* 05/05/2014 1229   KETONESUR NEGATIVE 05/05/2014 1229   PROTEINUR 100* 05/05/2014 1229   UROBILINOGEN 1.0 05/05/2014 1229   NITRITE POSITIVE* 05/05/2014 1229   LEUKOCYTESUR SMALL* 05/05/2014 1229   Urine tox: positive for cocaine, opiates, benzos, cannabis  Urine culture: pending   Studies/Results: Ct Abdomen Pelvis Wo Contrast  05/05/2014   CLINICAL DATA:  Acute right flank pain.  Gross hematuria.  EXAM: CT ABDOMEN AND PELVIS WITHOUT CONTRAST  TECHNIQUE: Multidetector CT imaging of the abdomen and pelvis was performed following the standard protocol without IV contrast.  COMPARISON:  CT scan of April 06, 2011.  FINDINGS: Visualized lung bases  appear normal. No significant adenopathy is noted.  No gallstones are noted. No focal abnormality is noted in the liver, spleen or pancreas. Adrenal glands appear normal. Bilateral nephrolithiasis is noted. Mild left hydronephrosis is noted secondary to 8 x 4 mm calculus in proximal left ureter. The appendix appears normal. There is no evidence of bowel obstruction. No abnormal fluid collection is noted. Urinary bladder is decompressed. No significant adenopathy is noted.  IMPRESSION: Bilateral nephrolithiasis. Mild left hydronephrosis secondary to 8 x 4 mm calculus in proximal left ureter.   Electronically Signed   By: Lupita RaiderJames  Green Jr, M.D.   On: 05/05/2014 13:05    Assessment/Plan:  1. Left ureterolithiasis - 8x94mm proximal ureteral stone with pain, vomiting, and hydronephrosis. Successful 6Fr x 24 cm JJ stent placement on 4/12.  - Follow up as outpatient with Dr. Patsi Searsannenbaum to schedule stone removal procedure. - Tamsulosin and oxybutynin PRN for stent discomfort/bladder spasms  2. UTI - nitrite positive urine and leukocytosis in the setting of ureteral stone.   - Follow up cultures and treat with culture specific antibiotics    LOS: 1 day   Ainsley Sanguinetti C 05/06/2014, 11:30 AM

## 2014-05-06 NOTE — Care Management Note (Signed)
CARE MANAGEMENT NOTE 05/06/2014  Patient:  Charles Stephens,Charles Stephens   Account Number:  [id number]  Date Initiated:  05/06/2014  Documentation initiated by:  Sandford CrazeLEMENTS,Demitria Hay  Subjective/Objective Assessment:   29 yo admitted with nephrolithiasis     Action/Plan:   From home with sister   Anticipated DC Date:  05/07/2014   Anticipated DC Plan:  HOME/SELF CARE      DC Planning Services  CM consult      Choice offered to / List presented to:             Status of service:  In process, will continue to follow Medicare Important Message given?   (If response is "NO", the following Medicare IM given date fields will be blank) Date Medicare IM given:   Medicare IM given by:   Date Additional Medicare IM given:   Additional Medicare IM given by:    Discharge Disposition:    Per UR Regulation:  Reviewed for med. necessity/level of care/duration of stay  If discussed at Long Length of Stay Meetings, dates discussed:    Comments:  05/06/14 Sandford Crazeora Stevi Hollinshead RN,BSN,NCM [phone] CM consult for pt having trouble obtaining medications.  Pt given [facility] packet and [facility] Pt encouraged to go ahead and make a follow up appointment there with a PCP and then at hospital discharge [facility]. CM will continue to follow.

## 2014-05-06 NOTE — Progress Notes (Signed)
[facility] PROGRESS NOTE  Tamera StandsMitchell Dreier ZOX:[id number] DOB: 25-Dec-1985 DOA: 05/05/2014 PCP: No PCP Per Patient  Assessment/Plan: 1. Left ureterolithiasis. -Patient presented with left flank pain, CT scan showing a by 4 mm proximal ureteral stone associated with hydronephrosis. -He was seen and evaluated by Dr. Patsi Searsannenbaum undergoing cystoscopy with stent placement to left ureter. -Patient tolerated procedure well -Continue empiric antimicrobial therapy, tamsulosin and oxybutynin when necessary for discomfort/bladder spasms  2.  Urinary tract infection. -Patient presenting with flank pain, CT showing nephrolithiasis with obstructing stone. He had a wet cystoscopy with placement of stent -Awaiting cultures, continue empiric IV antimicrobial therapy with ceftriaxone  3.  Polysubstance abuse -Urine drug screen showing presence of THC, cocaine, opiates, benzodiazepines -Social work consult  4.  Elevated transaminases -Patient with history of polysubstance abuse, pending hepatitis panel  Code Status: Full code Family Communication: Family not present Disposition Plan: Anticipate discharge in the next 24 hour sitter remained stable   Consultants:  Urology  Procedures:  Cystoscopy performed on 05/05/2014  Antibiotics:  Ceftriaxone 1 g IV every 24 hours  HPI/Subjective: Patient is a 29 year old with a past medical history of polysubstance abuse including heroin abuse, history of nephrolithiasis status post stent placements admitted to medicine service on 05/05/2014 where he presented with complaints of left flank pain. CT scan of abdomen revealed bilateral nephrolithiasis with mild left hydronephrosis secondary to 8 x 4 mm calculus and proximal left ureter. Urinalysis revealed the presence of many bacteria along with leukocytes and nitrates. Urology was consulted. Patient was started on ceftriaxone 1 g IV every 24 hours. He underwent cystoscopy on 05/05/2014 with stent  placement to left ureter, or seizure performed by Dr. Patsi Searsannenbaum.   Objective: Filed Vitals:   05/06/14 0616  BP: 96/50  Pulse: 62  Temp: 97.4 F (36.3 C)  Resp: 15    Intake/Output Summary (Last 24 hours) at 05/06/14 1450 Last data filed at 05/06/14 0600  Gross per 24 hour  Intake 1846.67 ml  Output      0 ml  Net 1846.67 ml   Filed Weights   05/05/14 1035 05/05/14 1739  Weight: 68.04 kg (150 lb) 68.04 kg (150 lb)    Exam:   General:  Patient is in no acute distress though complains of severe pain with urination, awake, alert  Cardiovascular: Regular rate and rhythm normal S1-S2 no murmurs rubs or gallops  Respiratory: Clear to auscultation bilaterally no wheezing rhonchi or rales  Abdomen soft, patient having tenderness to palpation over lower abdominal region  Musculoskeletal: no edema  Data Reviewed: Basic Metabolic Panel:  Recent Labs Lab 05/05/14 1123 05/06/14 0350  NA 142 141  K 4.5 4.6  CL 111 111  CO2 23 23  GLUCOSE 105* 81  BUN 14 14  CREATININE 1.01 1.18  CALCIUM 8.8 8.5   Liver Function Tests:  Recent Labs Lab 05/05/14 1123 05/06/14 0350  AST 106* 95*  ALT 246* 223*  ALKPHOS 70 63  BILITOT 0.6 0.4  PROT 6.7 6.4  ALBUMIN 4.1 3.6   No results for input(s): LIPASE, AMYLASE in the last 168 hours. No results for input(s): AMMONIA in the last 168 hours. CBC:  Recent Labs Lab 05/05/14 1123 05/06/14 0350  WBC 8.3 7.4  NEUTROABS 5.9  --   HGB 14.6 14.3  HCT 42.0 42.2  MCV 90.7 93.0  PLT 123* 110*   Cardiac Enzymes: No results for input(s): CKTOTAL, CKMB, CKMBINDEX, TROPONINI in the last 168 hours. BNP (last 3 results) No results for  input(s): BNP in the last 8760 hours.  ProBNP (last 3 results) No results for input(s): PROBNP in the last 8760 hours.  CBG: No results for input(s): GLUCAP in the last 168 hours.  Recent Results (from the past 240 hour(s))  MRSA PCR Screening     Status: None   Collection Time: 05/05/14  10:28 AM  Result Value Ref Range Status   MRSA by PCR NEGATIVE NEGATIVE Final    Comment:        The GeneXpert MRSA Assay (FDA approved for NASAL specimens only), is one component of a comprehensive MRSA colonization surveillance program. It is not intended to diagnose MRSA infection nor to guide or monitor treatment for MRSA infections.      Studies: Ct Abdomen Pelvis Wo Contrast  05/05/2014   CLINICAL DATA:  Acute right flank pain.  Gross hematuria.  EXAM: CT ABDOMEN AND PELVIS WITHOUT CONTRAST  TECHNIQUE: Multidetector CT imaging of the abdomen and pelvis was performed following the standard protocol without IV contrast.  COMPARISON:  CT scan of April 06, 2011.  FINDINGS: Visualized lung bases appear normal. No significant adenopathy is noted.  No gallstones are noted. No focal abnormality is noted in the liver, spleen or pancreas. Adrenal glands appear normal. Bilateral nephrolithiasis is noted. Mild left hydronephrosis is noted secondary to 8 x 4 mm calculus in proximal left ureter. The appendix appears normal. There is no evidence of bowel obstruction. No abnormal fluid collection is noted. Urinary bladder is decompressed. No significant adenopathy is noted.  IMPRESSION: Bilateral nephrolithiasis. Mild left hydronephrosis secondary to 8 x 4 mm calculus in proximal left ureter.   Electronically Signed   By: Lupita Raider, M.D.   On: 05/05/2014 13:05    Scheduled Meds: . cefTRIAXone (ROCEPHIN)  IV  1 g Intravenous Q24H  . enoxaparin (LOVENOX) injection  40 mg Subcutaneous Q24H  . nicotine  14 mg Transdermal Daily  . tamsulosin  0.4 mg Oral QPC supper   Continuous Infusions:   Principal Problem:   Nephrolithiasis Active Problems:   Acute UTI    Time spent: 35 min    Jeralyn Bennett  [facility] Pager [phone]. If 7PM-7AM, please contact night-coverage at [email or web address], password [facility] 05/06/2014, 2:50 PM  LOS: 1 day

## 2014-05-07 DIAGNOSIS — N200 Calculus of kidney: Secondary | ICD-10-CM | POA: Insufficient documentation

## 2014-05-07 LAB — CBC
HCT: 39.3 % (ref 39.0–52.0)
Hemoglobin: 13.2 g/dL (ref 13.0–17.0)
MCH: 30.8 pg (ref 26.0–34.0)
MCHC: 33.6 g/dL (ref 30.0–36.0)
MCV: 91.8 fL (ref 78.0–100.0)
Platelets: 105 10*3/uL — ABNORMAL LOW (ref 150–400)
RBC: 4.28 MIL/uL (ref 4.22–5.81)
RDW: 13.5 % (ref 11.5–15.5)
WBC: 5 10*3/uL (ref 4.0–10.5)

## 2014-05-07 LAB — BASIC METABOLIC PANEL
Anion gap: 4 — ABNORMAL LOW (ref 5–15)
BUN: 11 mg/dL (ref 6–23)
CO2: 25 mmol/L (ref 19–32)
Calcium: 8.7 mg/dL (ref 8.4–10.5)
Chloride: 112 mmol/L (ref 96–112)
Creatinine, Ser: 0.99 mg/dL (ref 0.50–1.35)
GFR calc Af Amer: >90 mL/min (ref >90)
GFR calc non Af Amer: >90 mL/min (ref >90)
GLUCOSE: 86 mg/dL (ref 70–99)
POTASSIUM: 4.3 mmol/L (ref 3.5–5.1)
SODIUM: 141 mmol/L (ref 135–145)

## 2014-05-07 MED ORDER — TAMSULOSIN HCL 0.4 MG PO CAPS: 0.4000 mg | ORAL_CAPSULE | Freq: Every day | ORAL | Status: DC

## 2014-05-07 MED ORDER — HYDROCODONE-ACETAMINOPHEN 5-325 MG PO TABS: 2.0000 | ORAL_TABLET | ORAL | Status: AC | PRN

## 2014-05-07 MED ORDER — TAMSULOSIN HCL 0.4 MG PO CAPS: 0.4000 mg | ORAL_CAPSULE | Freq: Every day | ORAL | Status: AC

## 2014-05-07 MED ORDER — OXYBUTYNIN CHLORIDE 5 MG PO TABS: 5.0000 mg | ORAL_TABLET | Freq: Three times a day (TID) | ORAL | Status: AC | PRN

## 2014-05-07 MED ORDER — OXYBUTYNIN CHLORIDE 5 MG PO TABS: 5.0000 mg | ORAL_TABLET | Freq: Three times a day (TID) | ORAL | Status: DC | PRN

## 2014-05-07 NOTE — Care Management Note (Addendum)
    Page 1 of 1   05/07/2014     3:21:13 PM CARE MANAGEMENT NOTE 05/07/2014  Patient:  Charles Stephens,Charles Stephens   Account Number:  [id number]  Date Initiated:  05/06/2014  Documentation initiated by:  Sandford CrazeLEMENTS,NORA  Subjective/Objective Assessment:   29 yo admitted with nephrolithiasis     Action/Plan:   From home with sister   Anticipated DC Date:  05/07/2014   Anticipated DC Plan:  HOME/SELF CARE      DC Planning Services  CM consult  MATCH Program      Choice offered to / List presented to:             Status of service:  Completed, signed off Medicare Important Message given?   (If response is "NO", the following Medicare IM given date fields will be blank) Date Medicare IM given:   Medicare IM given by:   Date Additional Medicare IM given:   Additional Medicare IM given by:    Discharge Disposition:  HOME/SELF CARE  Per UR Regulation:  Reviewed for med. necessity/level of care/duration of stay  If discussed at Long Length of Stay Meetings, dates discussed:    Comments:  05/07/14 Lanier ClamKathy Chinelo Benn RN BSN NCM [phone] Received call from Nsg- Arline AspCindy about patient unable to afford meds.ditropan/flomax-[facility].Patient has co pay $6 for each med.He understands that narcotic is not included w/MATCH program.Patient will come to 3w to see Arline AspCindy [facility], & [facility].Patient voiced understanding.  05/06/14 Sandford CrazeNora Clements RN,BSN,NCM [phone] CM consult for pt having trouble obtaining medications.  Pt given [facility] packet and explained the resources [facility]. Pt encouraged to go ahead and make a follow up appointment there with a PCP and then at hospital discharge [facility] to obtain help getting his medications. CM will continue to follow.

## 2014-05-07 NOTE — Progress Notes (Signed)
2 Days Post-Op Subjective: No issues. Negative culture.   Objective: Vital signs in last 24 hours: Temp:  [97.5 F (36.4 C)-98.3 F (36.8 C)] 97.5 F (36.4 C) (04/14 0541) Pulse Rate:  [72-85] 85 (04/14 0541) Resp:  [16] 16 (04/14 0541) BP: (106-126)/(63-89) 106/63 mmHg (04/14 0541) SpO2:  [100 %] 100 % (04/14 0541)  Intake/Output from previous day: 04/13 0701 - 04/14 0700 In: 480 [P.O.:480] Out: -  Intake/Output this shift:    Physical Exam:  General: Alert and oriented CV: RRR Lungs: Clear Abdomen: Soft, ND Ext: NT, No erythema  Lab Results:  Recent Labs  05/05/14 1123 05/06/14 0350 05/07/14 0400  HGB 14.6 14.3 13.2  HCT 42.0 42.2 39.3   BMET  Recent Labs  05/06/14 0350 05/07/14 0400  NA 141 141  K 4.6 4.3  CL 111 112  CO2 23 25  GLUCOSE 81 86  BUN 14 11  CREATININE 1.18 0.99  CALCIUM 8.5 8.7   Urinalysis    Component Value Date/Time   COLORURINE RED* 05/05/2014 1229   APPEARANCEUR TURBID* 05/05/2014 1229   LABSPEC 1.029 05/05/2014 1229   PHURINE 5.5 05/05/2014 1229   GLUCOSEU NEGATIVE 05/05/2014 1229   HGBUR LARGE* 05/05/2014 1229   BILIRUBINUR MODERATE* 05/05/2014 1229   KETONESUR NEGATIVE 05/05/2014 1229   PROTEINUR 100* 05/05/2014 1229   UROBILINOGEN 1.0 05/05/2014 1229   NITRITE POSITIVE* 05/05/2014 1229   LEUKOCYTESUR SMALL* 05/05/2014 1229   Urine tox: positive for cocaine, opiates, benzos, cannabis  Urine culture: negative   Studies/Results: Ct Abdomen Pelvis Wo Contrast  05/05/2014   CLINICAL DATA:  Acute right flank pain.  Gross hematuria.  EXAM: CT ABDOMEN AND PELVIS WITHOUT CONTRAST  TECHNIQUE: Multidetector CT imaging of the abdomen and pelvis was performed following the standard protocol without IV contrast.  COMPARISON:  CT scan of April 06, 2011.  FINDINGS: Visualized lung bases appear normal. No significant adenopathy is noted.  No gallstones are noted. No focal abnormality is noted in the liver, spleen or pancreas.  Adrenal glands appear normal. Bilateral nephrolithiasis is noted. Mild left hydronephrosis is noted secondary to 8 x 4 mm calculus in proximal left ureter. The appendix appears normal. There is no evidence of bowel obstruction. No abnormal fluid collection is noted. Urinary bladder is decompressed. No significant adenopathy is noted.  IMPRESSION: Bilateral nephrolithiasis. Mild left hydronephrosis secondary to 8 x 4 mm calculus in proximal left ureter.   Electronically Signed   By: Lupita RaiderJames  Green Jr, M.D.   On: 05/05/2014 13:05    Assessment/Plan:  1. Left ureterolithiasis - 8x104mm proximal ureteral stone with pain, vomiting, and hydronephrosis. Successful 6Fr x 24 cm JJ stent placement on 4/12.  - Follow up as outpatient with Dr. Patsi Searsannenbaum to schedule stone removal procedure. - Tamsulosin and oxybutynin PRN for stent discomfort/bladder spasms  2. UTI?- urine culture ultimately negative. No antibiotics necessary. Will repeat culture prior to definitive stone procedure.   Ok for D/C from urologic standpoint. Will arrange follow up with Dr. Patsi Searsannenbaum.    LOS: 2 days   Timmia Cogburn C 05/07/2014, 7:41 AM

## 2014-05-07 NOTE — Discharge Summary (Signed)
Physician Discharge Summary  Tamera StandsMitchell Cercone WUJ:[id number] DOB: 1985-12-13 DOA: 05/05/2014  PCP: No PCP Per Patient  Admit date: 05/05/2014 Discharge date: 05/07/2014  Time spent: 35 minutes  Recommendations for Outpatient Follow-up:  1. Patient will need follow up with Urology, s/p stent placement during this hospitaliztion  Discharge Diagnoses:  Principal Problem:   Nephrolithiasis Active Problems:   Acute UTI   Kidney stone   Discharge Condition: Stable  Diet recommendation: *Regular Diet  Filed Weights   05/05/14 1035 05/05/14 1739  Weight: 68.04 kg (150 lb) 68.04 kg (150 lb)    History of present illness:  29 y.o. male with history of heroin abuse and nephrolithiasis, [facility] (he does not know the name of the hospital), smoker of 1/2 ppd since age 29, [facility] with main concern of several hours duration of bilateral flank pain but left side worse than right side, sharp and constant, 10/10 in severity, occasionally but not consistently radiating to lower abd quadrants, worse with eating and movement and with no specific alleviating factors. Associated with nausea, one episode of non bloody vomiting, hematuria. Pt denies fevers, chills, dysuria, urinary urgency or frequency.   In ED, pt noted to be in mild distress due to pain and with no significant relief of symptoms with Dilaudid IV. CT abd notable for proximal left 8 x 4mm ureteral stone with mild hydro. UA nitrite positive with many RBCs and bacteria as well as calcium oxalate crystals. WBC and Cr WNL.   Hospital Course:  Patient is a 29 year old with a past medical history of polysubstance abuse including heroin abuse, history of nephrolithiasis status post stent placements admitted to medicine service on 05/05/2014 where he presented with complaints of left flank pain. CT scan of abdomen revealed bilateral nephrolithiasis with mild left hydronephrosis secondary to 8 x 4 mm  calculus and proximal left ureter. Urinalysis revealed the presence of many bacteria along with leukocytes and nitrates. Urology was consulted. Patient was started on ceftriaxone 1 g IV every 24 hours. He underwent cystoscopy on 05/05/2014 with stent placement to left ureter, procedure performed by Dr. Patsi Searsannenbaum. He remained stable and was tolerating PO intake. Urine cultures showed no growth. Urology did not recommend continuing antibiotics. He was discharge to his home in stable condition on 05/07/2014.   Procedures:  Cystoscopy performed on 05/05/2014  Consultations:  Urology  Discharge Exam: Filed Vitals:   05/07/14 0541  BP: 106/63  Pulse: 85  Temp: 97.5 F (36.4 C)  Resp: 16     General: Patient is in no acute distress though complains of severe pain with urination, awake, alert  Cardiovascular: Regular rate and rhythm normal S1-S2 no murmurs rubs or gallops  Respiratory: Clear to auscultation bilaterally no wheezing rhonchi or rales  Abdomen soft, patient having tenderness to palpation over lower abdominal region  Musculoskeletal: no edema  Discharge Instructions   Discharge Instructions    Call MD for:  difficulty breathing, headache or visual disturbances    Complete by:  As directed      Call MD for:  extreme fatigue    Complete by:  As directed      Call MD for:  hives    Complete by:  As directed      Call MD for:  persistant dizziness or light-headedness    Complete by:  As directed      Call MD for:  persistant nausea and vomiting    Complete by:  As  directed      Call MD for:  redness, tenderness, or signs of infection (pain, swelling, redness, odor or green/yellow discharge around incision site)    Complete by:  As directed      Call MD for:  severe uncontrolled pain    Complete by:  As directed      Call MD for:  temperature >100.4    Complete by:  As directed      Diet - low sodium heart healthy    Complete by:  As directed      Increase  activity slowly    Complete by:  As directed           Current Discharge Medication List    START taking these medications   Details  oxybutynin (DITROPAN) 5 MG tablet Take 1 tablet (5 mg total) by mouth 3 (three) times daily as needed for bladder spasms. Qty: 20 tablet, Refills: 0      CONTINUE these medications which have CHANGED   Details  HYDROcodone-acetaminophen (NORCO/VICODIN) 5-325 MG per tablet Take 2 tablets by mouth every 4 (four) hours as needed. Qty: 10 tablet, Refills: 0    tamsulosin (FLOMAX) 0.4 MG CAPS capsule Take 1 capsule (0.4 mg total) by mouth daily after supper. Qty: 30 capsule, Refills: 1      STOP taking these medications     diphenhydrAMINE (BENADRYL) 25 MG tablet      hydrocortisone cream 1 %      naproxen sodium (ANAPROX) 220 MG tablet      HYDROcodone-homatropine (HYCODAN) 5-1.5 MG/5ML syrup      ondansetron (ZOFRAN ODT) 4 MG disintegrating tablet      promethazine (PHENERGAN) 25 MG tablet        No Known Allergies Follow-up Information    Follow up with SIGMUND I TANNENBAUM, MD In 2 weeks.   Specialty:  Urology   Contact information:   [address] [place] [address] [phone]        The results of significant diagnostics from this hospitalization (including imaging, microbiology, ancillary and laboratory) are listed below for reference.    Significant Diagnostic Studies: Ct Abdomen Pelvis Wo Contrast  05/05/2014   CLINICAL DATA:  Acute right flank pain.  Gross hematuria.  EXAM: CT ABDOMEN AND PELVIS WITHOUT CONTRAST  TECHNIQUE: Multidetector CT imaging of the abdomen and pelvis was performed following the standard protocol without IV contrast.  COMPARISON:  CT scan of April 06, 2011.  FINDINGS: Visualized lung bases appear normal. No significant adenopathy is noted.  No gallstones are noted. No focal abnormality is noted in the liver, spleen or pancreas. Adrenal glands appear normal. Bilateral nephrolithiasis is noted. Mild  left hydronephrosis is noted secondary to 8 x 4 mm calculus in proximal left ureter. The appendix appears normal. There is no evidence of bowel obstruction. No abnormal fluid collection is noted. Urinary bladder is decompressed. No significant adenopathy is noted.  IMPRESSION: Bilateral nephrolithiasis. Mild left hydronephrosis secondary to 8 x 4 mm calculus in proximal left ureter.   Electronically Signed   By: Lupita Raider, M.D.   On: 05/05/2014 13:05    Microbiology: Recent Results (from the past 240 hour(s))  MRSA PCR Screening     Status: None   Collection Time: 05/05/14 10:28 AM  Result Value Ref Range Status   MRSA by PCR NEGATIVE NEGATIVE Final    Comment:        The GeneXpert MRSA Assay (FDA approved for NASAL specimens  only), is one component of a comprehensive MRSA colonization surveillance program. It is not intended to diagnose MRSA infection nor to guide or monitor treatment for MRSA infections.   Culture, Urine     Status: None   Collection Time: 05/05/14  5:50 PM  Result Value Ref Range Status   Specimen Description URINE, CLEAN CATCH  Final   Special Requests NONE  Final   Colony Count NO GROWTH Performed at [facility]   Final   Culture NO GROWTH Performed at [facility]   Final   Report Status 05/06/2014 FINAL  Final     Labs: Basic Metabolic Panel:  Recent Labs Lab 05/05/14 1123 05/06/14 0350 05/07/14 0400  NA 142 141 141  K 4.5 4.6 4.3  CL 111 111 112  CO2 GLUCOSE 105* 81 86  BUN CREATININE 1.01 1.18 0.99  CALCIUM 8.8 8.5 8.7   Liver Function Tests:  Recent Labs Lab 05/05/14 1123 05/06/14 0350  AST 106* 95*  ALT 246* 223*  ALKPHOS 70 63  BILITOT 0.6 0.4  PROT 6.7 6.4  ALBUMIN 4.1 3.6   No results for input(s): LIPASE, AMYLASE in the last 168 hours. No results for input(s): AMMONIA in the last 168 hours. CBC:  Recent Labs Lab 05/05/14 1123 05/06/14 0350 05/07/14 0400  WBC 8.3 7.4 5.0   NEUTROABS 5.9  --   --   HGB 14.6 14.3 13.2  HCT 42.0 42.2 39.3  MCV 90.7 93.0 91.8  PLT 123* 110* 105*   Cardiac Enzymes: No results for input(s): CKTOTAL, CKMB, CKMBINDEX, TROPONINI in the last 168 hours. BNP: BNP (last 3 results) No results for input(s): BNP in the last 8760 hours.  ProBNP (last 3 results) No results for input(s): PROBNP in the last 8760 hours.  CBG: No results for input(s): GLUCAP in the last 168 hours.     SignedJeralyn Bennett  [facility] 05/07/2014, 12:33 PM

## 2014-05-07 NOTE — Progress Notes (Signed)
{  Patient discharged to home, discharge instructions reviewed with patient who verbalized understanding. New RX's and work note given to patient.

## 2014-05-12 ENCOUNTER — Encounter: Payer: Self-pay | Admitting: Family Medicine

## 2014-05-12 ENCOUNTER — Ambulatory Visit: Payer: MEDICAID | Attending: Family Medicine | Admitting: Family Medicine

## 2014-05-12 VITALS — BP 123/82 | HR 65 | Temp 98.3°F | Resp 18 | Ht 69.0 in | Wt 144.0 lb

## 2014-05-12 DIAGNOSIS — N200 Calculus of kidney: Secondary | ICD-10-CM | POA: Insufficient documentation

## 2014-05-12 DIAGNOSIS — R319 Hematuria, unspecified: Secondary | ICD-10-CM

## 2014-05-12 LAB — CBC WITH DIFFERENTIAL/PLATELET
Basophils Absolute: 0 10*3/uL (ref 0.0–0.1)
Basophils Relative: 0 % (ref 0–1)
Eosinophils Absolute: 0.1 10*3/uL (ref 0.0–0.7)
Eosinophils Relative: 1 % (ref 0–5)
HCT: 43.4 % (ref 39.0–52.0)
Hemoglobin: 15 g/dL (ref 13.0–17.0)
LYMPHS ABS: 2.4 10*3/uL (ref 0.7–4.0)
Lymphocytes Relative: 23 % (ref 12–46)
MCH: 31.5 pg (ref 26.0–34.0)
MCHC: 34.6 g/dL (ref 30.0–36.0)
MCV: 91.2 fL (ref 78.0–100.0)
MONOS PCT: 5 % (ref 3–12)
MPV: 11.5 fL (ref 8.6–12.4)
Monocytes Absolute: 0.5 10*3/uL (ref 0.1–1.0)
NEUTROS PCT: 71 % (ref 43–77)
Neutro Abs: 7.3 10*3/uL (ref 1.7–7.7)
PLATELETS: 165 10*3/uL (ref 150–400)
RBC: 4.76 MIL/uL (ref 4.22–5.81)
RDW: 14.4 % (ref 11.5–15.5)
WBC: 10.3 10*3/uL (ref 4.0–10.5)

## 2014-05-12 MED ORDER — IBUPROFEN 600 MG PO TABS: 600.0000 mg | ORAL_TABLET | Freq: Three times a day (TID) | ORAL | Status: AC | PRN

## 2014-05-12 NOTE — Progress Notes (Signed)
Subjective:    Patient ID: Charles Stephens Guallpa, male    DOB: 1985-12-21, 29 y.o.   MRN: [id number]  HPI  Charles Stephens Sides [facility] 05/[facility]/14 and 05/07/14 after he had presented with flank pain, nausea, vomiting, hematuria. CT abdomen and pelvis revealed bilateral renal calculi with mild left hydronephrosis secondary to 8 x 4 mm calculus in the proximal left ureter. He was seen by urology and underwent cystoscopy and placement of a stent in the left ureter.  Of note he has had a previous stent placed in the right ureter. During his hospital course he was on IV ceftriaxone urine cultures came back negative and so IV antibiotics discontinued with the plan for an outpatient stone removal procedure.  Interval History: He  Denies fevers or dysuria but has had hematuria Has left flank pain and abdominal pain for which he takes Ibuprofen. Denies nausea, vomiting and has a good appetite.  Review of Systems  General: negative for fever, weight loss, appetite change Eyes: no visual symptoms. ENT: no ear symptoms, no sinus tenderness, no nasal congestion or sore throat. Neck: no pain  Respiratory: no wheezing, shortness of breath, cough Cardiovascular: no chest pain, no dyspnea on exertion, no pedal edema, no orthopnea. Gastrointestinal: no abdominal pain, no diarrhea, no constipation Genito-Urinary: see HPI Hematologic: no bruising Endocrine: no cold or heat intolerance Neurological: no headaches, no seizures, no tremors Musculoskeletal: no joint pains, no joint swelling Skin: no pruritus, no rash. Psychological: no depression, no anxiety,       Objective:  Filed Vitals:   05/12/14 1421  BP: 123/82  Pulse: 65  Temp: 98.3 F (36.8 C)  Resp: 18      Physical Exam  Constitutional: normal appearing,  Eyes: PERRLA HENT: Head is atraumatic, normal sinuses, normal oropharynx, normal appearing tonsils and palate Neck: normal range of motion, no  thyromegaly, no JVD cardiovascular: normal rate and rhythm, normal heart sounds, no murmurs, rub or gallop, no pedal edema Respiratory: clear to auscultation bilaterally, no wheezes, no rales, no rhonchi Abdomen: soft, tender to palpation in suprapubic region, normal bowel sounds, no enlarged organs Extremities: Full ROM, no tenderness in joints; left flank tenderness. Skin: warm and dry, no lesions. Neurological: alert, oriented x3, cranial nerves I-XII grossly intact Psychological: normal mood.  Lab Results  Component Value Date   WBC 5.0 05/07/2014   HGB 13.2 05/07/2014   HCT 39.3 05/07/2014   MCV 91.8 05/07/2014   PLT 105* 05/07/2014    CLINICAL DATA: Acute right flank pain. Gross hematuria.  EXAM: CT ABDOMEN AND PELVIS WITHOUT CONTRAST  TECHNIQUE: Multidetector CT imaging of the abdomen and pelvis was performed following the standard protocol without IV contrast.  COMPARISON: CT scan of April 06, 2011.  FINDINGS: Visualized lung bases appear normal. No significant adenopathy is noted.  No gallstones are noted. No focal abnormality is noted in the liver, spleen or pancreas. Adrenal glands appear normal. Bilateral nephrolithiasis is noted. Mild left hydronephrosis is noted secondary to 8 x 4 mm calculus in proximal left ureter. The appendix appears normal. There is no evidence of bowel obstruction. No abnormal fluid collection is noted. Urinary bladder is decompressed. No significant adenopathy is noted.  IMPRESSION: Bilateral nephrolithiasis. Mild left hydronephrosis secondary to 8 x 4 mm calculus in proximal left ureter.   Electronically Signed  By: Lupita RaiderJames Green Jr, M.D.  On: 05/05/2014 13:05        Assessment & Plan:  29 year old male patient with a history  of renal calculi status post stent to the left ureter currently with hematuria and left flank pain.  Nephrolithiasis: The patient has a positive urine drug screen and so I have  explained to him that I will be unable to give him controlled prescriptions at this time but I have placed him on ibuprofen as needed. I'm sending off a CBC given he still has some hematuria. Advised to give the urology office a call to schedule an appointment for his second surgery as his 2 week timeframe falls around the beginning of next week.

## 2014-05-12 NOTE — Progress Notes (Signed)
Patient [facility] for [facility] to clear out infection, admitted 4/12-4/15, patient to have second surgery in 2 weeks to remove kidney stone. Patient reports throbbing pain in testicles at level 7, intermittent sharp pain in lower, left back at level 5. Hurts to urinate. Patient has internal stent in place.

## 2014-05-12 NOTE — Patient Instructions (Signed)

## 2014-05-13 ENCOUNTER — Telehealth: Payer: Self-pay

## 2014-05-13 NOTE — Progress Notes (Signed)
Quick Note:  Please inform the patient that labs are normal. Thank you. ______ 

## 2014-05-13 NOTE — Telephone Encounter (Signed)
Nurse called patient to make him aware of lab results. Nurse reached automated recording stating "voicemail has not been setup".

## 2014-05-13 NOTE — Telephone Encounter (Signed)
-----   Message from Enobong Amao, MD sent at 05/13/2014  1:21 PM EDT ----- Please inform the patient that labs are normal. Thank you. 

## 2014-05-15 NOTE — Telephone Encounter (Signed)
-----   Message from Jaclyn ShaggyEnobong Amao, MD sent at 05/13/2014  1:21 PM EDT ----- Please inform the patient that labs are normal. Thank you.

## 2014-05-15 NOTE — Telephone Encounter (Signed)
Nurse called patient to make him aware of lab results. Nurse reached automated recording stating "voicemail has not been setup".  

## 2014-05-19 LAB — HEPATITIS PANEL, ACUTE
HCV AB: REACTIVE — AB
HEP B C IGM: NONREACTIVE
HEP B S AG: NEGATIVE
Hep A IgM: NONREACTIVE

## 2014-05-22 ENCOUNTER — Telehealth: Payer: Self-pay

## 2014-05-22 ENCOUNTER — Other Ambulatory Visit: Payer: Self-pay | Admitting: Family Medicine

## 2014-05-22 DIAGNOSIS — B182 Chronic viral hepatitis C: Secondary | ICD-10-CM

## 2014-05-22 DIAGNOSIS — B1920 Unspecified viral hepatitis C without hepatic coma: Secondary | ICD-10-CM | POA: Insufficient documentation

## 2014-05-22 NOTE — Telephone Encounter (Signed)
-----   Message from Enobong Amao, MD sent at 05/22/2014  8:28 AM EDT ----- Please inform him that one of his labs from hospitalization revealed he has Hepatitis C; he will need to be scheduled for additional labs against Tuesday 05/26/14 to determine if his Hep C needs treatment. 

## 2014-05-22 NOTE — Telephone Encounter (Signed)
Nurse called patient, male answered telephone. Nurse asked for patient. Person that answered said Clovis RileyMitchell was not there and wanted to know who was calling. Nurse told him it was Herbert SetaHeather and asked if he could give Clovis RileyMitchell a message. Nurse left number for return call.

## 2014-05-27 NOTE — Telephone Encounter (Signed)
Nurse called patient, reached automated voice mail explaining patient has a voicemail that has not been setup.

## 2014-05-27 NOTE — Telephone Encounter (Signed)
-----   Message from Jaclyn ShaggyEnobong Amao, MD sent at 05/22/2014  8:28 AM EDT ----- Please inform him that one of his labs from hospitalization revealed he has Hepatitis C; he will need to be scheduled for additional labs against Tuesday 05/26/14 to determine if his Hep C needs treatment.

## 2014-05-28 NOTE — Telephone Encounter (Signed)
Patient aware of normal lab results in clinic. See previous note.

## 2014-05-28 NOTE — Telephone Encounter (Signed)
Nurse called patient, patient verified date of birth. Nurse informed patient of normal lab results from labs drawn in clinic. Nurse informed patient labs drawn in hospital show patient has Hepatitis C. Patient has questions about Hepatitis C. Nurse gave basic education about hepatitis being an inflamed liver. Nurse educated patient on Hepatitis C can be transferred by needles with drug use and can be transferred sexually. Nurse advised patient to use condoms with intercourse. Patient aware of need for labs to determine if he needs treatment. Patient wants to meet with Dr. Venetia NightAmao for a better understanding of Hepatitis C. Patient will have labs drawn during that appointment. Patient transferred to front office staff to schedule appointment on Jun 02, 2014 at 3:30pm.

## 2014-06-02 ENCOUNTER — Ambulatory Visit: Payer: Self-pay | Admitting: Family Medicine

## 2014-06-05 ENCOUNTER — Ambulatory Visit: Payer: Self-pay

## 2016-02-16 ENCOUNTER — Inpatient Hospital Stay
Admit: 2016-02-16 | Discharge: 2016-02-18 | Discharge status: Against Medical Advice | Payer: MEDICAID | Attending: Psychiatry | Admitting: Psychiatry

## 2016-02-16 DIAGNOSIS — F10239 Alcohol dependence with withdrawal, unspecified: Principal | ICD-10-CM

## 2016-02-16 LAB — DRUG SCREEN, URINE
AMPHETAMINES: NEGATIVE
BARBITURATES: NEGATIVE
BENZODIAZEPINES: POSITIVE
COCAINE: NEGATIVE
METHADONE: NEGATIVE
OPIATES: NEGATIVE
PCP(PHENCYCLIDINE): NEGATIVE
THC (TH-CANNABINOL): POSITIVE
TRICYCLICS: NEGATIVE

## 2016-02-16 LAB — URINALYSIS W/ RFLX MICROSCOPIC
Bilirubin: NEGATIVE
Blood: NEGATIVE
Glucose: NEGATIVE mg/dL
Ketone: NEGATIVE mg/dL
Leukocyte Esterase: NEGATIVE
Nitrites: NEGATIVE
Protein: NEGATIVE mg/dL
Specific gravity: 1.025 (ref 1.002–1.030)
Urobilinogen: 1 EU/dL (ref 0–1)
pH (UA): 7.5 (ref 4.5–8.0)

## 2016-02-16 LAB — CBC WITH AUTOMATED DIFF
ABS. BASOPHILS: 0 10*3/uL (ref 0.0–0.1)
ABS. EOSINOPHILS: 0.1 10*3/uL (ref 0.0–0.5)
ABS. IMM. GRANS.: 0 10*3/uL
ABS. LYMPHOCYTES: 1.2 10*3/uL (ref 0.8–3.5)
ABS. MONOCYTES: 0.5 10*3/uL — ABNORMAL LOW (ref 0.8–3.5)
ABS. NEUTROPHILS: 4.7 10*3/uL (ref 1.5–8.0)
BASOPHILS: 0 % (ref 0–2)
EOSINOPHILS: 1 % (ref 0–5)
HCT: 46 % (ref 41–53)
HGB: 16.3 g/dL (ref 13.5–17.5)
IMMATURE GRANULOCYTES: 0 % — ABNORMAL LOW (ref 2–10)
LYMPHOCYTES: 18 % — ABNORMAL LOW (ref 19–48)
MCH: 33.7 PG — ABNORMAL HIGH (ref 27–31)
MCHC: 35.4 g/dL (ref 31–37)
MCV: 95 FL (ref 80–100)
MONOCYTES: 7 % (ref 3–9)
MPV: 11.4 FL — ABNORMAL HIGH (ref 5.9–10.3)
NEUTROPHILS: 74 % (ref 40–74)
PLATELET: 94 10*3/uL — ABNORMAL LOW (ref 130–400)
RBC: 4.84 M/uL (ref 4.7–6.1)
RDW: 12.7 % (ref 11.5–14.5)
WBC: 6.4 10*3/uL (ref 4.5–10.8)

## 2016-02-16 LAB — THYROID PANEL W/TSH
Free thyroxine index: 2.2 (ref 1.4–5.2)
T3 Uptake: 29 % — ABNORMAL LOW (ref 31–39)
T4, Total: 7.5 ug/dL (ref 4.7–13.3)
TSH: 0.46 u[IU]/mL (ref 0.35–3.74)

## 2016-02-16 LAB — METABOLIC PANEL, COMPREHENSIVE
A-G Ratio: 1.3 (ref 1.2–2.2)
ALT (SGPT): 272 U/L — ABNORMAL HIGH (ref 12–78)
AST (SGOT): 191 U/L — ABNORMAL HIGH (ref 15–37)
Albumin: 4.1 g/dL (ref 3.4–5.0)
Alk. phosphatase: 87 U/L (ref 45–117)
Anion gap: 7 mmol/L (ref 6–15)
BUN/Creatinine ratio: 17 (ref 7–25)
BUN: 12 MG/DL (ref 7–18)
Bilirubin, total: 0.7 MG/DL (ref <1.1)
CO2: 24 mmol/L (ref 21–32)
Calcium: 8.8 MG/DL (ref 8.5–10.1)
Chloride: 111 mmol/L — ABNORMAL HIGH (ref 98–107)
Creatinine: 0.71 MG/DL (ref 0.60–1.30)
GFR est AA: >60 mL/min/{1.73_m2} (ref >60)
GFR est non-AA: >60 mL/min/{1.73_m2} (ref >60)
Globulin: 3.1 g/dL (ref 2.4–3.5)
Glucose: 94 mg/dL (ref 70–110)
Potassium: 4.9 mmol/L (ref 3.5–5.3)
Protein, total: 7.2 g/dL (ref 6.4–8.2)
Sodium: 142 mmol/L (ref 136–145)

## 2016-02-16 LAB — PHOSPHORUS: Phosphorus: 2.1 MG/DL — ABNORMAL LOW (ref 2.5–4.9)

## 2016-02-16 LAB — SALICYLATE: Salicylate level: 3.3 MG/DL (ref 2.8–20.0)

## 2016-02-16 LAB — ETHYL ALCOHOL: ALCOHOL(ETHYL),SERUM: 5 MG/DL

## 2016-02-16 LAB — MAGNESIUM: Magnesium: 2 mg/dL (ref 1.8–2.4)

## 2016-02-16 LAB — ACETAMINOPHEN: Acetaminophen level: <10 ug/mL — ABNORMAL LOW (ref 10–30)

## 2016-02-16 MED ORDER — LORAZEPAM 2 MG/ML IJ SOLN
2 mg/mL | Freq: Once | INTRAMUSCULAR | Status: AC
Start: 2016-02-16 — End: 2016-02-16
  Administered 2016-02-16: 17:00:00 via INTRAVENOUS

## 2016-02-16 MED ORDER — THIAMINE HCL 100 MG TAB
100 mg | Freq: Every day | ORAL | Status: DC
Start: 2016-02-16 — End: 2016-02-18
  Administered 2016-02-17 – 2016-02-18 (×2): via ORAL

## 2016-02-16 MED ORDER — LORAZEPAM 0.5 MG TAB
0.5 mg | ORAL | Status: DC | PRN
Start: 2016-02-16 — End: 2016-02-18
  Administered 2016-02-16: via ORAL

## 2016-02-16 MED ORDER — MAGNESIUM HYDROXIDE 2,400 MG/10 ML ORAL SUSP
2400 mg/10 mL | Freq: Four times a day (QID) | ORAL | Status: DC | PRN
Start: 2016-02-16 — End: 2016-02-18

## 2016-02-16 MED ORDER — OXAZEPAM 10 MG CAP
10 mg | Freq: Two times a day (BID) | ORAL | Status: DC
Start: 2016-02-16 — End: 2016-02-17

## 2016-02-16 MED ORDER — IBUPROFEN 600 MG TAB
600 mg | Freq: Four times a day (QID) | ORAL | Status: DC | PRN
Start: 2016-02-16 — End: 2016-02-18
  Administered 2016-02-16 – 2016-02-17 (×2): via ORAL

## 2016-02-16 MED ORDER — MULTIVITAMIN TAB
Freq: Every day | ORAL | Status: DC
Start: 2016-02-16 — End: 2016-02-18
  Administered 2016-02-17 – 2016-02-18 (×2): via ORAL

## 2016-02-16 MED ORDER — FOLIC ACID 1 MG TAB
1 mg | Freq: Every day | ORAL | Status: DC
Start: 2016-02-16 — End: 2016-02-18
  Administered 2016-02-17 – 2016-02-18 (×2): via ORAL

## 2016-02-16 MED ORDER — HYDROXYZINE PAMOATE 50 MG CAP
50 mg | Freq: Four times a day (QID) | ORAL | Status: DC | PRN
Start: 2016-02-16 — End: 2016-02-18
  Administered 2016-02-16 – 2016-02-17 (×2): via ORAL

## 2016-02-16 MED ORDER — SODIUM CHLORIDE 0.9 % IV
100 mg/mL | Freq: Once | INTRAVENOUS | Status: AC
Start: 2016-02-16 — End: 2016-02-16
  Administered 2016-02-16: 17:00:00 via INTRAVENOUS

## 2016-02-16 MED ORDER — ALUM-MAG HYDROXIDE-SIMETH 400 MG-400 MG-40 MG/5 ML ORAL SUSP
400-400-40 mg/5 mL | ORAL | Status: DC | PRN
Start: 2016-02-16 — End: 2016-02-18

## 2016-02-16 MED ORDER — OXAZEPAM 10 MG CAP
10 mg | Freq: Three times a day (TID) | ORAL | Status: DC
Start: 2016-02-16 — End: 2016-02-17

## 2016-02-16 MED ORDER — BISMUTH SUBSALICYLATE 262 MG/15 ML ORAL SUSP
262 mg/15 mL | ORAL | Status: DC | PRN
Start: 2016-02-16 — End: 2016-02-18
  Administered 2016-02-17: 20:00:00 via ORAL

## 2016-02-16 MED ORDER — TRAZODONE 150 MG TAB
150 mg | Freq: Every evening | ORAL | Status: DC | PRN
Start: 2016-02-16 — End: 2016-02-18
  Administered 2016-02-17: 03:00:00 via ORAL

## 2016-02-16 MED ORDER — OXAZEPAM 15 MG CAP
15 mg | Freq: Three times a day (TID) | ORAL | Status: DC
Start: 2016-02-16 — End: 2016-02-17
  Administered 2016-02-17: 03:00:00 via ORAL

## 2016-02-16 MED ORDER — NICOTINE 21 MG/24 HR DAILY PATCH
21 mg/24 hr | Freq: Every day | TRANSDERMAL | Status: DC | PRN
Start: 2016-02-16 — End: 2016-02-18

## 2016-02-16 MED FILL — LORAZEPAM 2 MG/ML IJ SOLN: 2 mg/mL | INTRAMUSCULAR | Qty: 1

## 2016-02-16 MED FILL — NICOTINE 21 MG/24 HR DAILY PATCH: 21 mg/24 hr | TRANSDERMAL | Qty: 1

## 2016-02-16 MED FILL — HYDROXYZINE PAMOATE 50 MG CAP: 50 mg | ORAL | Qty: 1

## 2016-02-16 MED FILL — LORAZEPAM 0.5 MG TAB: 0.5 mg | ORAL | Qty: 2

## 2016-02-16 MED FILL — SODIUM CHLORIDE 0.9 % IV: INTRAVENOUS | Qty: 1000

## 2016-02-16 MED FILL — IBUPROFEN 600 MG TAB: 600 mg | ORAL | Qty: 1

## 2016-02-16 NOTE — Consults (Signed)
This note will not be viewable in MyChart.        Consult Note      Patient: [name] [name]               Sex: male             MRN: [id number]      Date of Birth:  [birth year]      Age:  31 y.o.               Subjective:     [name] [name] is a 31 y.o. male who has been seen for medical management after admission to [facility] for detox from alcohol. States that he drinks approx 1/5 of vodka a day for the last "few months" and his last drink was last night. He is having some tremors currently but no hallucinations. He reports he has Hep C, but no other current medical problems.    Past Medical History:   Diagnosis Date   ??? Infectious disease     Hep C   ??? Liver disease     Hep C       History reviewed. No pertinent surgical history.    Family History   Problem Relation Age of Onset   ??? Family history unknown: Yes       Social History     Social History   ??? Marital status: MARRIED     Spouse name: N/A   ??? Number of children: N/A   ??? Years of education: N/A     Social History Main Topics   ??? Smoking status: Current Every Day Smoker     Packs/day: 1.00     Years: 17.00   ??? Smokeless tobacco: Never Used   ??? Alcohol use Yes      Comment: 5th of vodka a day or a 12 pack of beer.    ??? Drug use: Yes     Special: Marijuana   ??? Sexual activity: Not Asked     Other Topics Concern   ??? None     Social History Narrative   ??? None       Prior to Admission medications    Not on File       No Known Allergies    Review of Systems  A comprehensive review of systems was negative except for that written in the History of Present Illness.      Objective:      Visit Vitals   ??? BP 125/84   ??? Pulse 73   ??? Temp 98.1 ??F (36.7 ??C)   ??? Resp 20   ??? Ht 5\' 8"  (1.727 m)   ??? Wt 142 lb 6.4 oz (64.6 kg)   ??? SpO2 96%   ??? BMI 21.65 kg/m2       Physical Exam:  General:   Alert, cooperative, well-nourished, well-developed, appears stated age   Eyes:   Sclera anicteric. Pupils equally round and reactive to light.   Mouth/Throat:  Mucous  membranes normal, oral pharynx clear.   Neck:  Supple, no JVD, no masses, no bruits.   Lungs:    Clear to auscultation bilaterally, good effort.   CV:   Regular rate and rhythm, no murmur, no click, rub or gallop.   Abdomen:    Soft, non-tender, bowel sounds normal, non-distended.   Extremities:  No cyanosis or edema.   Skin:  Skin color, texture, turgor normal. No acute rash or lesions.   Lymph  nodes:  Cervical and supraclavicular normal.   Musculoskeletal:  No swelling or deformity.   Lines/Devices:   Intact, no erythema, drainage or tenderness.   Psych:  Alert and oriented, normal mood affect given the setting.   Neuro: CN 1-12 intact. Normal sensation to light touch. Can smell alcohol wipe. MS 5/5 UE's and LE's. Normal DTR's bil. EOM's intact. PERRLA.    Assessment/Plan     1. Alcohol abuse  2. Hep C    Here for detox per PSych. Medically stable. Will see as needed.
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
## 2016-02-16 NOTE — Other (Signed)
Intake Disposition   Psychiatrist Recommendation      Psychiatrist Consulted:  Ayobola A. Daria Pasturesloworaran, MD         Phone/Pager: [phone]    Date/Time: 02/16/2016 @ 1345    Recommendation by Psychiatrist (IP/OP/IOP/other): IP Detox Unit    [facility]  If BH, room number patient assigned to: 105-01    For [facility]:    Pt has been medically cleared and consulted with Dr. Louanna RawAbbott,  including review of labs.  Dr. Daria Pasturesloworaran has agreed to accept this patient with the following orders:  Standard alcohol detox protocol with CIWA, Serax taper, seizure precautions and fall precautions. Pt has been assigned to room 105-01.  [facility]ale [facility] aware [facility], room assignment and orders.  ED aware of disposition and room assignment.  ED to consult hospitalist as per protocol.      ______________________________________________________________________      If outpatient:    Name of Agency/Psychiatrist:    Phone number:      Date/time of follow up appointment:

## 2016-02-16 NOTE — ED Triage Notes (Signed)
Patient ambulated to triage. Patient requests detox from alcohol. Patient states he consumes either a 5th of vodka a day or a 12 pack of beer. Patient states his last drink was this morning around 0100.

## 2016-02-16 NOTE — Treatment Summary (Signed)
MTP1[facility]  [facility]  Inpatient [facility]al Health Services  INITIAL Treatment Plan        Date of Admit:  02/16/2016   Patient's Name    [name] [name] DOB:  04-12-1985 Date Plan Initiated:    02/16/2016 MRN:  [id number]     Admission Dx: Alcohol detox, Depression, Anxiety, Tobacco Abuse     Problem  Number PROBLEM  Include Significant Medical Issues Date Established Status      A = Active  D = Deferred  I = Inactive  R = Resolved  M = Monitoring   1. Alcohol Withdrawl 02/20/2016 A   2 Depression 02/16/2016 A   3 Anxiety 02/16/2016 A   4 Tobacco Abuse 02/16/2016 A   5        Goals   Interventions/Modality         STG: Pt will remain safe for next 24 hours.    STG: Pt will verbalize all Suicidal and/or homicidal thoughts to staff for the next 24 hours.   STG: Pt will verbalize an understanding of possible symptoms and be able to identify at least 2 active symptoms.    STG: Pt will communicate the reason for admission and identify at least one goal for their treatment.    STG:Pt will verbalize an understanding of unit rules and expectations.    STG: Pt will communicate an understanding of one beginning symptoms of their Dx.     LTG: Pt will begin identifying at least one support system [employer](Community, family, peer, church official etc).   Nursing staff will monitored for safety including removing unsafe items, assessing current commitment for safety, and monitoring.    Nursing staff will provide medication as prescribed by physician at each prescribed time.    Nursing staff will complete vital assessment per shift to monitor medical symptoms.    Nursing staff will report symptoms assessed and identified during their shift at shift change to oncoming nurse.    Nursing staff will monitor observation type (1:1, elopment, etc)    Nursing staff will monitor precaution type (falls, suicide, etc)    Other:     Preliminary Discharge Plan  (outpatient, inpatient, substance abuse,  mental health, etc): Inpatient medical detox at least 3-5 days for therapy, group, and medication adjustments then pt requests long term rehab placement   Preliminary Discharge Location (return home, name of nursing home, rehab facility etc):  Pt requests long term rehab placement post hospital discharge   Staff Initiating Care Plan: Danella PentonS Stepehn Eckard RN

## 2016-02-16 NOTE — Other (Signed)
TRANSFER - OUT REPORT:    Verbal report given to [employer] RN (name) on Tamera StandsMitchell Pol  being transferred to behavioral (unit) for routine progression of care       Report consisted of patient???s Situation, Background, Assessment and   Recommendations(SBAR).     Information from the following report(s) SBAR, Kardex, ED Summary, [facility] and Recent Results was reviewed with the receiving nurse.    Lines:   Peripheral IV 02/16/16 Right Antecubital (Active)   Site Assessment Clean, dry, & intact 02/16/2016 12:03 PM   Phlebitis Assessment 0 02/16/2016 12:03 PM   Infiltration Assessment 0 02/16/2016 12:03 PM   Dressing Status Clean, dry, & intact 02/16/2016 12:03 PM   Dressing Type Transparent 02/16/2016 12:03 PM        Opportunity for questions and clarification was provided.      Patient transported with:   [employer]   Security  intake

## 2016-02-16 NOTE — ED Notes (Signed)
requests detox from alcohol. Patient states he consumes either a 5th of vodka a day or a 12 pack of beer. Patient states his last drink was this morning around 0100. Aaox3, nad, pwd, states [facility]t high BP and there were unable to treat him.

## 2016-02-16 NOTE — Behavioral Health Treatment Team (Addendum)
Admitted to the services of Dr Daria Pastures for Alcohol Detox, Depression, Anxiety, Tobacco Abuse  Patient has had a long history of alcohol abuse since age of 31 with longest period of sobriety being 1 year.  Pt currently is drinking 1 fifth of vodka daily for past 2 months. Last use  was yesterday  Pt also uses marijuana weekly and has for years The patient reports he buys substance illegaly from streets.  Pt denies any other forms of substance use at this time however tox screen was + for benzos.  Pt appears disheveled but dressed appropriately for season Pt reports sleeping pattern sleeping 4-5 hours/night  Pt states appetite good and  has not experienced weight loss Patient has no previous psych diagnosis  Pt rates Depression 10 out of 10 and anxiety 10 out of 10.  CIWA 12 Pt reports headache, shakes sweats and chills  Pt commits to safety on the unit  Denies A/V/T hallucinations OR SI/HI   Dual skin assessment completed by 2 RN's revealing no open skin areas Pt is homeless and desires long term rehab post hospital discharge  Inpatient order verified and correct in connect care.  Pt takes no PTA medications  Consent for treatment signed by patient.  ID verified and armband aplied per protocol. Unit rules reviewed and patient verbalized understanding.  All personal belongings identified and secured.  Body search completed per protocol no contraband found. Patient wanded and brought onto unit per nursing staff   Unit tour given and assigned to room 105-1 Patient has no further questions at this time. Orders processing.  Will continue to monitor.        APPETITE ASSESSMENT  Percentage of Patient's Food Consumption:Pt has not eaten on unit as of yet  Clinician's Observations Pt experienced no gastric distress or N/V this shift     SUICIDE RE-ASSESSMENT  In the past 12 hours have you had thoughts of hurting yourself: No  If no when was the last time you have had thoughts of suicide N/A  Explain the thoughts: N/A   What plan does the patient have: N/A  On scale of 1-10 how does patient rate suicidal thoughts: 0    Nurses objective of patients reported suicidal thoughts:     On the nurses scale of 1-5 where does your patient rate 1= no suicidal ideations  NURSING WRAP UP GROUP  TOPIC OF GROUP:  Positive  Reflections    Leader of Group: Lum Babe Leader     START TIME  1800  STOP TIME     1845    Patient Participation: Pt did not attend group    Describe Patient's involvement in group:  No participation     INTERVENTIONS  Medication Orders/Changes by Physician this Shift: NONE    TREATMENT  PLAN PROBLEM LIST & CORRELATING GOALS/INTERVENTIONS      Problem #  Problem     1 ETOH withdrawl    2 Depression    3 Anxiety   4 Tobacco Abuse         Clinician's Observations:CIWA  Medicated as ordered and started serax taper  Remains anxious and depressed  Pt utilizing nicotine replacement therapy    CLIENT RESPONSE TO TREATMENT  Patient Response to Medication Orders/Changes by Physician this Shift: Compliant  Describe Patient Response to Master Treatment Plan's Goals/Interventions: Accepting    Readiness to Change  Patient Motivation for Change: Motivated  PLAN  1:1 interaction provided therapeutic listening, interactive/commmunication with patient, and educated patient on  coping skills to deal with harmful thoughts and impulses.  Current coping skills assessed.  Educated and encouraged patient to comply with treatment plan.  Administered medications as prescribed.  Teaching done on all medications administered. Maintained patient safety this shift.

## 2016-02-16 NOTE — ED Provider Notes (Addendum)
HPI Comments: Pt presents to the dept today for voluntary detox from alcohol.  Pt is A&Ox3. States that he drinks approx 1/5 of vodka a day for the last "few months" and his last drink was last night. He is having some tremors currently but no hallucinations.     Patient is a 31 y.o. male presenting with alcohol problem. The history is provided by the patient. No language interpreter was used.   Alcohol Problem   There areno confusion, no loss of consciousness, no seizures, no weakness, no agitation, no delusions, no hallucinations, no self-injury, no violence and no intoxication present at this time.  This is a new problem. The current episode started more than 1 week ago. Suspected agents include alcohol. Pertinent negatives include no fever, no injury, no nausea, no vomiting, no bladder incontinence and no bowel incontinence. Associated medical issues do not include suicidal ideas and homicidal ideas.        Past Medical History:   Diagnosis Date   ??? Infectious disease     Hep C   ??? Liver disease     Hep C       History reviewed. No pertinent surgical history.      Family History:   Problem Relation Age of Onset   ??? Family history unknown: Yes       Social History     Social History   ??? Marital status: N/A     Spouse name: N/A   ??? Number of children: N/A   ??? Years of education: N/A     Occupational History   ??? Not on file.     Social History Main Topics   ??? Smoking status: Current Every Day Smoker     Packs/day: 1.00     Years: 17.00   ??? Smokeless tobacco: Never Used   ??? Alcohol use Yes      Comment: 5th of vodka a day or a 12 pack of beer.    ??? Drug use: Yes     Special: Marijuana   ??? Sexual activity: Not on file     Other Topics Concern   ??? Not on file     Social History Narrative   ??? No narrative on file         ALLERGIES: Review of patient's allergies indicates no known allergies.    Review of Systems   Constitutional: Negative for activity change and fever.    Respiratory: Negative.  Negative for shortness of breath and wheezing.    Cardiovascular: Negative.  Negative for chest pain and leg swelling.   Gastrointestinal: Negative.  Negative for bowel incontinence, nausea and vomiting.   Genitourinary: Negative.  Negative for bladder incontinence.   Musculoskeletal: Negative.    Skin: Negative.    Neurological: Positive for tremors. Negative for dizziness, seizures, loss of consciousness and weakness.   Psychiatric/Behavioral: Negative for agitation, confusion, hallucinations, homicidal ideas, self-injury and suicidal ideas. The patient is nervous/anxious.        Vitals:    02/16/16 1050   BP: (!) 140/102   Pulse: 95   Resp: 18   Temp: 98.1 ??F (36.7 ??C)   SpO2: 100%   Weight: 64.6 kg (142 lb 6.4 oz)   Height: 5\' 8"  (1.727 m)            Physical Exam   Constitutional: He is oriented to person, place, and time. He appears well-developed and well-nourished. He is cooperative.  Non-toxic appearance.   Cardiovascular: Normal rate,  regular rhythm, normal heart sounds and intact distal pulses.    Pulmonary/Chest: Effort normal and breath sounds normal. He has no decreased breath sounds. He has no wheezes.   Abdominal: Soft. Bowel sounds are normal. There is no tenderness.   Musculoskeletal: Normal range of motion.   Neurological: He is alert and oriented to person, place, and time. He has normal strength. GCS eye subscore is 4. GCS verbal subscore is 5. GCS motor subscore is 6.   Skin: Skin is warm and dry.   Psychiatric: His speech is normal and behavior is normal. His mood appears anxious. Thought content is not paranoid. Cognition and memory are normal. He exhibits a depressed mood. He expresses no homicidal and no suicidal ideation.   Nursing note and vitals reviewed.       MDM  Number of Diagnoses or Management Options     Amount and/or Complexity of Data Reviewed  Clinical lab tests: ordered and reviewed  Discussion of test results with the performing providers: yes   Discuss the patient with other providers: yes  Independent visualization of images, tracings, or specimens: yes    Risk of Complications, Morbidity, and/or Mortality  Presenting problems: high  Diagnostic procedures: low  Management options: moderate      ED Course   Will review labs and/or xray and discuss the case with the attending physician  I personally saw and examined the patient.  I have reviewed and agree with the MLP's findings, including all diagnostic interpretations, and plans as written.   I was present during the key portions of separately billed procedures.  Pt has been drinking a fifth a day for the last month.  Has been in detox before.  Denies h/o seizures.  Seen by me after ativan given, ciwa 6.  Pt stable, will call dr Louanna Rawabbott and admit for detox  Aileen Fassodney Lauren Modisette, DO        Procedures

## 2016-02-16 NOTE — ED Notes (Signed)
Patient denies any suicidal or homicidal ideations.

## 2016-02-16 NOTE — Consults (Signed)
This note will not be viewable in MyChart.        Consult Note      Patient: [name] [name]               Sex: male             MRN: [id number]      Date of Birth:  January 13, 1986      Age:  31 y.o.               Subjective:     [name] StandsMitchell Therien is a 31 y.o. male who has been seen for medical management after [facility] from alcohol. States that he drinks approx 1/5 of vodka a day for the last "few months" and his last drink was last night. He is having some tremors currently but no hallucinations. He reports he has Hep C, but no other current medical problems.    Past Medical History:   Diagnosis Date   ??? Infectious disease     Hep C   ??? Liver disease     Hep C       History reviewed. No pertinent surgical history.    Family History   Problem Relation Age of Onset   ??? Family history unknown: Yes       Social History     Social History   ??? Marital status: MARRIED     Spouse name: N/A   ??? Number of children: N/A   ??? Years of education: N/A     Social History Main Topics   ??? Smoking status: Current Every Day Smoker     Packs/day: 1.00     Years: 17.00   ??? Smokeless tobacco: Never Used   ??? Alcohol use Yes      Comment: 5th of vodka a day or a 12 pack of beer.    ??? Drug use: Yes     Special: Marijuana   ??? Sexual activity: Not Asked     Other Topics Concern   ??? None     Social History Narrative   ??? None       Prior to Admission medications    Not on File       No Known Allergies    Review of Systems  A comprehensive review of systems was negative except for that written in the History of Present Illness.      Objective:      Visit Vitals   ??? BP 125/84   ??? Pulse 73   ??? Temp 98.1 ??F (36.7 ??C)   ??? Resp 20   ??? Ht 5\' 8"  (1.727 m)   ??? Wt 142 lb 6.4 oz (64.6 kg)   ??? SpO2 96%   ??? BMI 21.65 kg/m2       Physical Exam:  General:   Alert, cooperative, well-nourished, well-developed, appears stated age   Eyes:   Sclera anicteric. Pupils equally round and reactive to light.    Mouth/Throat:  Mucous membranes normal, oral pharynx clear.   Neck:  Supple, no JVD, no masses, no bruits.   Lungs:    Clear to auscultation bilaterally, good effort.   CV:   Regular rate and rhythm, no murmur, no click, rub or gallop.   Abdomen:    Soft, non-tender, bowel sounds normal, non-distended.   Extremities:  No cyanosis or edema.   Skin:  Skin color, texture, turgor normal. No acute rash or lesions.   Lymph  nodes:  Cervical and supraclavicular normal.   Musculoskeletal:  No swelling or deformity.   Lines/Devices:   Intact, no erythema, drainage or tenderness.   Psych:  Alert and oriented, normal mood affect given the setting.   Neuro: CN 1-12 intact. Normal sensation to light touch. Can smell alcohol wipe. MS 5/5 UE's and LE's. Normal DTR's bil. EOM's intact. PERRLA.    Assessment/Plan     1. Alcohol abuse  2. Hep C    Here for detox per PSych. Medically stable. Will see as needed.

## 2016-02-16 NOTE — ED Notes (Signed)
Seizure precautions initiated - padded side rails applied

## 2016-02-17 LAB — LIPID PANEL
Cholesterol, total: 159 MG/DL (ref <200)
HDL Cholesterol: 60 MG/DL (ref 32–96)
LDL, calculated: 87.32 MG/DL (ref <130)
Triglyceride: 73 MG/DL (ref <150)
VLDL, calculated: 14.6 MG/DL (ref 5–32)

## 2016-02-17 LAB — VITAMIN B12: Vitamin B12: 495 pg/mL (ref 254–1320)

## 2016-02-17 LAB — FOLATE: Folate: 17.3 ng/mL (ref 3.1–17.5)

## 2016-02-17 LAB — GLUCOSE, RANDOM: Glucose: 86 mg/dL (ref 70–110)

## 2016-02-17 MED ORDER — OXAZEPAM 10 MG CAP
10 mg | Freq: Three times a day (TID) | ORAL | Status: DC
Start: 2016-02-17 — End: 2016-02-18
  Administered 2016-02-18: 14:00:00 via ORAL

## 2016-02-17 MED ORDER — OXAZEPAM 15 MG CAP
15 mg | Freq: Three times a day (TID) | ORAL | Status: DC
Start: 2016-02-17 — End: 2016-02-18

## 2016-02-17 MED ORDER — OXAZEPAM 10 MG CAP
10 mg | Freq: Three times a day (TID) | ORAL | Status: DC
Start: 2016-02-17 — End: 2016-02-18

## 2016-02-17 MED ORDER — OXAZEPAM 15 MG CAP
15 mg | Freq: Three times a day (TID) | ORAL | Status: AC
Start: 2016-02-17 — End: 2016-02-17
  Administered 2016-02-17 – 2016-02-18 (×3): via ORAL

## 2016-02-17 MED FILL — OXAZEPAM 15 MG CAP: 15 mg | ORAL | Qty: 2

## 2016-02-17 MED FILL — VITAMIN B-1 100 MG TABLET: 100 mg | ORAL | Qty: 1

## 2016-02-17 MED FILL — MULTIVITAMIN TAB: ORAL | Qty: 1

## 2016-02-17 MED FILL — FOLIC ACID 1 MG TAB: 1 mg | ORAL | Qty: 1

## 2016-02-17 MED FILL — BISMUTH SUBSALICYLATE 262 MG/15 ML ORAL SUSP: 262 mg/15 mL | ORAL | Qty: 30

## 2016-02-17 MED FILL — TRAZODONE 150 MG TAB: 150 mg | ORAL | Qty: 1

## 2016-02-17 NOTE — Behavioral Health Treatment Team (Signed)
Psych eval done:  [id number]    dvt prophy is ambulation

## 2016-02-17 NOTE — Other (Signed)
[facility]  [facility]  Inpatient Behavioral Health Services  Master Treatment Plan      INPATIENT MASTER TREATMENT PLAN   Date of Adm:  02/16/2016   Signature/Credential of Staff Initiating:    Emani Taussig L. Willa Rough, [place]   Date Plan Initiated:    02/17/2016 MTP plan initated date/time:    02/17/2016  12:59 PM Anticipated Date of Discharge:    02/20/2016 Legal Status:   - Voluntary   - Involuntary   DIAGNOSIS   DIAGNOSES:  AXIS I:  Alcohol withdrawal.  Alcohol use disorder, severe.  AXIS II:  Deferred for the time being.  AXIS III:  Hepatitis C, elevated liver enzymes.  AXIS IV:  Moderate-to-severe to include homelessness.   AXIS V:  About 30.     ASSESSMENTS     Depression (0-10): 10/10   Anxiety (0-10):??10/10   Suicide Risk Score: high   Violence/Aggression Risk Score: 2   Alcohol Audit Score: 34        INVENTORY OF PATIENT STRENGTHS / LIMITATIONS       (At least 2 of each)S = Strength L = Limitation)   S L Item S L Item S L Item     Verbal Expression   Family Support   Financial Status     Physical Status   Social Supports   Employment Status     Degree of Insight   Education/Cognition   Prior Response to Treatment     Ambulation/Transportation   Motivation   Other:    DISCHARGE PLAN         Home alone   ??   Home with family ?????  ??   Home other than family ?????  ??   Group home ?????  ??   Assisted apartment living ?????  ??   Nursing home ?????  ??   Rest home ?????  ??   Assisted living ?????  ??   Family care home ?????  ??   Homeless shelter ?????  ??   Residential treatment ??TBD???  ??   Shelter: ?????  ??   Correctional Facility: ?????  ??   Other hospital: ????? ??   [facility]  ??   Day treatment at ?????  ??   Individual therapy with ?????  ??   Family therapy with ?????  ??   Case management with ?????  ??   Substance abuse treatment   ??   Respite ?????  ??   Mental health center  ??   Vocational rehabilitation ?????  ??    Medication management with Dr. ?????  ??   Recovery Support Groups: ?????  ??   Other: ????? ??   Home health with ?????  ??   Home medical equipment ?????  ??   Medical services with Dr. ?????  ??   Nutritional needs : ?????  ??   Other Referrals:   ??     ??  Other Community Resource Needs/Referrals: (Describe Below):  ??   Educational needs :    ??   Recreational/Social Referrals: ?????           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 A As evidenced by, pt drinking since age of 43, currently drinking 1 case of beer and 1-2 fifths of vodka, and smoking 1 joint a week since age 16   2 Tobacco Use Disorder 02/17/2016 A As evidenced by, pt smoking 1 PPD since age of 39   3 Depression 02/17/2016 A As evidenced by, pt report of depressed mood, insomnia, feelings of worthlessness/guilt, difficulty concentrating, hopelessness, helplessness and feelings of inadequacy and worthlessness   4 Anxiety 02/17/2016 A As evidenced by, pt report of decreased sleep, feelings of apprehension/worry, loss of appetite, muscle tension, poor concentration/attention, racing thoughts, restlessness   5 Suicide Risk 02/17/2016 A As evidenced by, suicide risk assessment score of high risk      Problem  Number Goals   Interventions/Modality Target Date/Progression           1 LTG: Pt will identify self as an addict Target Date:02/20/2016      STG-Patient will verbalize Step 1 of the 12 steps of AA by day 4   Nursing will provide at least 1 educational group per day for 50 minutes.    Recreation will provide at least 1 recreational group per day for 50 minutes.    Therapy will provide at least 1 therapy group session per day for 50 minutes    PCT will provide at least 1 group session per day for 50 minutes.     Nursing staff will offer a Daily wrap up/reflection group for at least 30 minutes a day.     Physician will prescribe medications as needed based on daily evaluations.    Nursing staff will complete 15 minute checks on pt.    Physician will order 1:1 staffing for pt for suicide precautions.    Physician will order within line of sight supervision.    Physician will put pt on fall precautions.    Therapy will assist pt in identifying appropriate follow up plan.    Therapy will complete family conference     Nursing staff will monitor symptoms and provide medical interventions when needed.    Other:  Target Date: 02/20/2016    Progression:        STG- Patient will attend at least 1 AA meeting in the unit prior to discharge.   Nursing will provide at least 1 educational group per day for 50 minutes.    Recreation will provide at least 1 recreational group per day for 50 minutes.    Therapy will provide at least 1 therapy group session per day for 50 minutes    PCT will provide at least 1 group session per day for 50 minutes.    Nursing staff will offer a Daily wrap up/reflection group for at least 30 minutes a day.     Physician will prescribe medications as needed based on daily evaluations.    Nursing staff will complete 15 minute checks on pt.    Physician will order 1:1 staffing for pt for suicide precautions.    Physician will order within line of sight supervision.    Physician will put pt on fall precautions.    Therapy will assist pt in identifying appropriate follow up plan.    Therapy will complete family conference     Nursing staff will monitor symptoms and  provide medical interventions when needed.    Other:   Target Date: 02/20/2016    Progression:       2 LTG: Pt will abstain from tobacco use  100% of the time during hospitalization.  Target Date:02/20/2016      STG-Pt will utilize nicotine replacement options during hospitalization.    Nursing will provide at least 1 educational group per day for 50  minutes.    Recreation will provide at least 1 recreational group per day for 50 minutes.    Therapy will provide at least 1 therapy group session per day for 50 minutes    PCT will provide at least 1 group session per day for 50 minutes.    Nursing staff will offer a Daily wrap up/reflection group for at least 30 minutes a day.     Physician will prescribe medications as needed based on daily evaluations.    Nursing staff will complete 15 minute checks on pt.    Physician will order 1:1 staffing for pt for suicide precautions.    Physician will order within line of sight supervision.    Physician will put pt on fall precautions.    Therapy will assist pt in identifying appropriate follow up plan.    Therapy will complete family conference     Nursing staff will monitor symptoms and provide medical interventions when needed.    Other:  Target Date: 02/20/2016    Progression:        STG-  Pt will verbalize an understanding of the effects of tobacco use at least 1x prior to discharge.   Nursing will provide at least 1 educational group per day for 50 minutes.    Recreation will provide at least 1 recreational group per day for 50 minutes.    Therapy will provide at least 1 therapy group session per day for 50 minutes    PCT will provide at least 1 group session per day for 50 minutes.    Nursing staff will offer a Daily wrap up/reflection group for at least 30 minutes a day.     Physician will prescribe medications as needed based on daily evaluations.    Nursing staff will complete 15 minute checks on pt.    Physician will order 1:1 staffing for pt for suicide precautions.    Physician will order within line of sight supervision.    Physician will put pt on fall precautions.    Therapy will assist pt in identifying appropriate follow up plan.    Therapy will complete family conference     Nursing staff will monitor symptoms and  provide medical interventions when needed.    Other:   Target Date: 02/20/2016     Progression:       3 LTG:  Patient's depressed mood will be improved/stabilized to an extent that they can be treated in a less intensive level of care through medication management and utilizing learned, effective coping skills as evidenced by:  Improved sleep Target Date:02/20/2016      STG-Patient will comply with medication regimen as prescribed by physician every day by day 3.   Nursing will provide at least 1 educational group per day for 50 minutes.    Recreation will provide at least 1 recreational group per day for 50 minutes.    Therapy will provide at least 1 therapy group session per day for 50 minutes    PCT will provide at least 1 group session per day for 50 minutes.    Nursing staff will offer a Daily wrap up/reflection group for at least 30 minutes a day.     Physician will prescribe medications as needed based on daily evaluations.  Nursing staff will complete 15 minute checks on pt.    Physician will order 1:1 staffing for pt for suicide precautions.    Physician will order within line of sight supervision.    Physician will put pt on fall precautions.    Therapy will assist pt in identifying appropriate follow up plan.    Therapy will complete family conference     Nursing staff will monitor symptoms and provide medical interventions when needed.    Other:  Target Date: 02/20/2016    Progression:        STG- Patient will sleep at least 6 hours per night for 2 consecutive days prior to discharge.       Nursing will provide at least 1 educational group per day for 50 minutes.    Recreation will provide at least 1 recreational group per day for 50 minutes.    Therapy will provide at least 1 therapy group session per day for 50 minutes    PCT will provide at least 1 group session per day for 50 minutes.    Nursing staff will offer a Daily wrap up/reflection group for at least 30 minutes a day.     Physician will prescribe medications as needed based on daily evaluations.     Nursing staff will complete 15 minute checks on pt.    Physician will order 1:1 staffing for pt for suicide precautions.    Physician will order within line of sight supervision.    Physician will put pt on fall precautions.    Therapy will assist pt in identifying appropriate follow up plan.    Therapy will complete family conference     Nursing staff will monitor symptoms and  provide medical interventions when needed.    Other:   Target Date: 02/20/2016    Progression:       4 LTG:  Patient will be able to stabilize anxiety level while increasing ability to function on a daily basis by day of discharge.   Target Date:02/20/2016      STG-Patient will be able to verbally identify how worries are irrational by day 4   Nursing will provide at least 1 educational group per day for 50 minutes.    Recreation will provide at least 1 recreational group per day for 50 minutes.    Therapy will provide at least 1 therapy group session per day for 50 minutes    PCT will provide at least 1 group session per day for 50 minutes.    Nursing staff will offer a Daily wrap up/reflection group for at least 30 minutes a day.     Physician will prescribe medications as needed based on daily evaluations.    Nursing staff will complete 15 minute checks on pt.    Physician will order 1:1 staffing for pt for suicide precautions.    Physician will order within line of sight supervision.    Physician will put pt on fall precautions.    Therapy will assist pt in identifying appropriate follow up plan.    Therapy will complete family conference     Nursing staff will monitor symptoms and provide medical interventions when needed.    Other:  Target Date: 02/20/2016    Progression:        STG- Patient will take prescribed medication and report any side effects to appropriate staff.     Nursing will provide at least 1 educational group per day for 50 minutes.    Recreation will  provide at least 1 recreational group per day for 50 minutes.     Therapy will provide at least 1 therapy group session per day for 50 minutes    PCT will provide at least 1 group session per day for 50 minutes.    Nursing staff will offer a Daily wrap up/reflection group for at least 30 minutes a day.     Physician will prescribe medications as needed based on daily evaluations.    Nursing staff will complete 15 minute checks on pt.    Physician will order 1:1 staffing for pt for suicide precautions.    Physician will order within line of sight supervision.    Physician will put pt on fall precautions.    Therapy will assist pt in identifying appropriate follow up plan.    Therapy will complete family conference     Nursing staff will monitor symptoms and  provide medical interventions when needed.    Other:   Target Date: 02/20/2016    Progression:       5 LTG: Pt will express no suicidal ideation or intent by day of discharge.   Target Date:02/20/2016      STG-Patient will make no more than 2 statements of suicidal ideation by day 4   Nursing will provide at least 1 educational group per day for 50 minutes.    Recreation will provide at least 1 recreational group per day for 50 minutes.    Therapy will provide at least 1 therapy group session per day for 50 minutes    PCT will provide at least 1 group session per day for 50 minutes.    Nursing staff will offer a Daily wrap up/reflection group for at least 30 minutes a day.     Physician will prescribe medications as needed based on daily evaluations.    Nursing staff will complete 15 minute checks on pt.    Physician will order 1:1 staffing for pt for suicide precautions.    Physician will order within line of sight supervision.    Physician will put pt on fall precautions.    Therapy will assist pt in identifying appropriate follow up plan.    Therapy will complete family conference     Nursing staff will monitor symptoms and provide medical interventions when needed.     Other: Patient placed on suicide precautions Target Date: 02/20/2016    Progression:        STG- Patient will complete a safety plan by day of discharge, which includes identifying support persons to contact    Nursing will provide at least 1 educational group per day for 50 minutes.    Recreation will provide at least 1 recreational group per day for 50 minutes.    Therapy will provide at least 1 therapy group session per day for 50 minutes    PCT will provide at least 1 group session per day for 50 minutes.    Nursing staff will offer a Daily wrap up/reflection group for at least 30 minutes a day.     Physician will prescribe medications as needed based on daily evaluations.    Nursing staff will complete 15 minute checks on pt.    Physician will order 1:1 staffing for pt for suicide precautions.    Physician will order within line of sight supervision.    Physician will put pt on fall precautions.    Therapy will assist pt in identifying appropriate follow up plan.    Therapy will  complete family conference     Nursing staff will monitor symptoms and  provide medical interventions when needed.    Other:   Target Date: 02/20/2016    Progression:       PATIENT INVOLVEMENT IN TREATMENT PLAN      Treatment Plan was developed with input from and discussed with patient/guardian/conservator/designee?   - Yes, completed with staff during individual session   - Yes, completed during treatment planning team meet   - No, please explain:        FAMILY/SIGNIFICANT OTHER INVOLVEMENT IN TREATMENT PLAN      Was family involved in the development of treatment plan:     - Yes, family is involved   - No, Family not involved   - No known family   - Patient does not wish family involvement, explain     If family participates in care and treatment and shared their goal for patient, please write it here:                               Patient/Guardian Statement    I have reviewed revisions and/or additions to my treatment plan.  I have  been given the opportunity to participate in planning my care and asking questions of the staff, and have received explanations regarding treatment.      __________________________________________ ______________ ___________  Signature      Date   Time    [ ]  Did not sign    Reason did not sign: _____________________________________________________    __________________________________________ _____________ ___________  Rayne Du (if patient not able)             Date   Time    [ ]  Reviewed with Guardian/Conservator by telephone:  (requires two witness signatures)    Name:  ________________________________________Phone: _______________________    Treatment Team Signatures    Nursing Staff Signature         Printed Name & Credentials Date & Time   Social Work/Therapy Signature         Printed Name & Credentials Date & Time   Activity Therapy Signature         Printed Name & Credentials Date & Time   Buyer, retail (if applicable)         Printed Name & Credentials Date & Time   Other signature         Printed Name & Credentials Date & Time     PHYSICIAN CERTIFICATION OF THE LEVEL OF CARE:  I certify that this patient's inpatient psychiatric hospital admission is medically necessary for treatment which can reasonably be expected to improve the patient's condition and/or for diagnostic study.  I estimate _____days/____weeks of hospitalization is necessary for proper treatment of the patient.  My plans for post-hospital care for this patient are:____________________    _____________________________________________________________________    ______________________________________________________________________      Physician Signature         Printed Name & Credentials Date & Time
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
## 2016-02-17 NOTE — Behavioral Health Treatment Team (Signed)
Counselor met with patient to discuss discharge planning. Patient reports he does not want to go to Rehab.  Counselor offered to refer patient to Crisis, IOP and Outpatient.  However, patient was adamant he had a friend who was going to arrange all of those appointments for him.  Counselor continued to try and encourage patient to allow her to make referral appointments for him, but he continued to decline. Patient stated he wants to discharge to his friend's house and that his sister's boyfriend is going to take him to AA/NA and set him up with Outpatient Counseling.    Counselor called patient's friend, who stated that patient can discharge to her home and that she had already planned on helping him to obtain a [place] ID, medical card, and take him to receive mental health and substance abuse counseling.      ROIs for sister and friend have been completed and handed off for upload into patient's chart    Friend  Tilda Franco  [address] [place] [address]  [phone]    Sister  Abelino Derrick  [phone]
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
## 2016-02-17 NOTE — Behavioral Health Treatment Team (Signed)
Patient declined to attend scheduled therapy group at 1pm. Counselor tried to encourage patient to attend, however, he continued to decline.

## 2016-02-17 NOTE — Behavioral Health Treatment Team (Signed)
Report given to oncoming nurse using sbar, walking rounds, and kardex

## 2016-02-17 NOTE — Behavioral Health Treatment Team (Signed)
Bedside and Verbal shift change report given to Lee RN (oncoming nurse) by Cathy RN (offgoing nurse). Report included the following information SBAR, Kardex and Recent Results.

## 2016-02-17 NOTE — Other (Signed)
BEHAVIORAL HEALTH  ACTIVITY SPECIALIST ASSESSMENT    Admission Date: 02/16/16 D/X: ETOH     Evaluation Date: 02/17/2016 Evaluation Time: 1300 Precautions: None     Physical Function ADL: self-feeding, grooming, bathing, upper body dressing, lower body dressing, toileting, toilet transfer, shower transfer, tub transfer and simple home management Ambulatory: Yes     Place "X" next to selection.  Assistive Function [ ]   Walker [ ]  Cane [ ]  Wheelchair [ ]  Other     Place "X" next to selection.  Following Directions Attention Span Motivation Level   [ ]  Unable to follow simple commands. [ ]  5 Minutes or Less [ ]  Maximum encouragement needed.   [x ] Able to follow simple commands. [ ]  5-15 Minutes [ ]  Moderate encouragement needed.   [ ]  Able to follow multi-step commands. [x ] 15-30 Minutes [x ] Minimum encouragement needed.    [ ]  60 Minutes or More [ ]  No encouragement needed.     Patient's Living Situation: homeless    Past Interests & Hobbies Current Interests & Hobbies   1. 1.Playing with his kids   2. 2.[place]risbee golf   3. 3.     What does the patient do to cope with presenting problem (i.e. Stress, Depression, Trigger, etc.): Listen to music    Place "X" next to selection; Specify Leisure Activities & Barriers.  Enjoyed Leisure Activities Barriers to Enjoyed Leisure Activities   [ ]  Social:  [ ]  Mobility:    [x ] Solitary:  [ ]  Endurance:    [x ] Physical:  [ ]  Coordination:    [ ]  Creative:  [ ]  Flexibility:    [x ] Outdoor:  [x ] Finance:    [ ]  Spectator:  [x ] Time Availability:    [x ] Passive:  [x ] Transportation:    [ ]  Other:  [ ]  Cognitive:     [ ]  Physical Disability:     [ ]  Other:      Does patient believe these activities are beneficial to their overall well-being: Yes    Place "X" next to selection.  Motivators for Recreation/Leisure Involvement     [ ]  Relaxation [x ] Fun/Entertainment   [x ] Physical Benefits [ ]  Intellectual Stimulation   [ ]  Social Interaction [ ]  Creative Expression    [x ] Self Esteem or Sense of Accomplishment      Place "X" next to selection.  Patient Strengths & Areas of Potential     [ ]  Interests & Hobbies [ ]  Insight into Leisure Needs   [ ]  Reality Oriented [ ]  Motivated for Change   [ ]  Social Support [ ]  Good Physical Health   [ ]  Education [ ]  Positive Coping Skills     Place "X" next to selection  Patient Educational Needs     [ ]  Self-Esteem [ ]  Relaxation   [ ]  Depression [ ]  Social Skills   [ ]  Anger Control [ ]  Grief & Loss   [ ]  Leisure Awareness [ ]  Stress Management   [ ]  Leisure Education [ ]  Coping Skills     Are there any leisure activities the patient is interested in upon discharge: No: N/A. Explain: N/A    Arrangements staff can make to help facilitate these activities: N/A    Lance B. Sydnor  ______________________________  Activities Specialist  02/17/2016

## 2016-02-17 NOTE — Other (Signed)
Therapeutic Recreation Note: Pt declined scheduled 3pm recreation group opting to remain in bed??despite prompting from AT.

## 2016-02-17 NOTE — Behavioral Health Treatment Team (Signed)
[facility]  INPATIENT BEHAVIORAL HEALTH  NURSING PROGRESS NOTE  Tamera StandsMitchell Schonberg  02/17/2016    BEHAVIORAL ASSESSMENT    Patient agitated this morning and confrontational. Stated he feels "like shit". Complains of leg cramps and abdominal cramps. C/o depression and anxiety 5/10. Denies si/hi/avh. Well kempt.      SUICIDE RE-ASSESSMENT  In the past 12 hours have you had thoughts of hurting yourselfno(yes or no)  (If no when was the last time you have had thoughts of suicide)  Explain the thoughts (descriptive information) (This is where you describe the plan and the means to carry out the plan)  What plan does the patient have    On scale of 1-10 how does patient rate suicidal thoughts 0    Nurses objective of patients reported suicidal thoughts1ex. Minimizing, denial active thoughts etc be descriptive with assessment)   On the nurses scale of 1-5 where does your patient rate 1= no suicidal ideations, 2= vague thoughts, 3= strong thoughts with no plan; 4= thoughts with plan low lethality high chance of rescue; 5= strong thoughts with plan and means to carry out plan high lethality low chance of rescue)      NURSING GROUP:1300-1400  nutrition      INTERVENTIONS      Treatment Plan Problem List & Correlating Goals/Interventions    Problem # Problem   1 Alcohol withdrawal   2 depression   3 anxiety   4 Tobacco abuse           CLIENT RESPONSE TO TREATMENT    PLAN  1:1 provided therapeutic listening, therapeutic communication with patient, and educated patient on coping skills to deal with harmful thoughts and impulses. Current coping skills assessed. Educated and encouraged patient to comply with treatment plan. Administered medications as prescribed. Teaching done on all medications administered. Maintained patient safety this shift.

## 2016-02-17 NOTE — Other (Signed)
Spirituality Group Note: Patient declined scheduled 9 AM group session.

## 2016-02-17 NOTE — Behavioral Health Treatment Team (Signed)
Sleep Assessment  Hours nightly slept: 7  Difficulty Falling Asleep: No  Difficulty Staying Asleep: No  Broken Sleep: No  Clinician's Observations: unremarkable  ??

## 2016-02-17 NOTE — Other (Signed)
Therapeutic Recreation Note: Pt declined scheduled 11am recreation group opting to remain in bed??despite prompting from AT.

## 2016-02-18 MED FILL — VITAMIN B-1 100 MG TABLET: 100 mg | ORAL | Qty: 1

## 2016-02-18 MED FILL — BISMUTH SUBSALICYLATE 262 MG/15 ML ORAL SUSP: 262 mg/15 mL | ORAL | Qty: 30

## 2016-02-18 MED FILL — OXAZEPAM 10 MG CAP: 10 mg | ORAL | Qty: 2

## 2016-02-18 MED FILL — OXAZEPAM 15 MG CAP: 15 mg | ORAL | Qty: 2

## 2016-02-18 MED FILL — MULTIVITAMIN TAB: ORAL | Qty: 1

## 2016-02-18 MED FILL — FOLIC ACID 1 MG TAB: 1 mg | ORAL | Qty: 1

## 2016-02-18 MED FILL — NICOTINE 21 MG/24 HR DAILY PATCH: 21 mg/24 hr | TRANSDERMAL | Qty: 1

## 2016-02-18 NOTE — Discharge Summary (Signed)
[facility]    DISCHARGE SUMMARY    Name:[name] [name], [name] [name]  MR#: [id number]  DOB: [birth year]  ACCOUNT #: [id number]   ADMIT DATE: 02/16/2016  DISCHARGE DATE: 02/18/2016    DISCHARGING SERVICE:  Elizebeth BrookingScott Ayshia Gramlich, MD    LOCATION:  Behavioral Medicine     DISCHARGE TYPE:  Against medical advice.    DISCHARGING DIAGNOSES:  AXIS I:  Alcohol withdrawal, also alcohol use disorder, severe .  AXIS II:  Deferred.  AXIS III:  Hepatitis C, elevated liver enzymes.  AXIS IV: Moderate to severe, to include recent homelessness.    AXIS V:  About 50 at discharge.    DISCHARGE MENTAL STATUS EXAMINATION:  Shows alert, oriented Caucasian male.  Speech, dress, hygiene and behavior unremarkable.  Mood was "okay."  Affect was relatively indifferent to the evaluation and unremarkable.  He is oriented x3.  Intelligence in the average range.  He denied suicidal or homicidal ideas.  He denied auditory or visual hallucinations.  There is no evidence of delusions or psychosis.  Memory and concentration were intact.  Insight and judgment poor.  Fund of knowledge is adequate.    PRESENTING PROBLEM/HOSPITAL COURSE:  Please refer to my original H and P done 02/17/2016 for the presenting problem.  In brief, this gentleman presented to the hospital complaining of the need for alcohol detoxification.  He was noted to be in mild withdrawal at time of admission.  He also had been homeless, having used his time up in the local shelter.    The patient was placed in a protective environment in the hospital and started on a Serax protocol.  The patient had no significant issues while he was here.  On the day that he left, he announced that he was feeling better.  Therapy notes indicated that he had secured housing with a family member in the local area, and the patient no longer seemed interested in detox.  The patient was cautioned at length that he could have increasing levels of withdrawal, seizures or even die, and almost certainly would  relapse if he left.  The patient was able to hear this, repeat it back, and express an understanding of it.  The patient accepted the risk of leaving, and would absolutely not allow us to refer him to rehab, which he really needs to go to.  The patient expressed no other severe symptoms while he was here, and was in no apparent distress, alert and able to make such a decision at discharge.    LABORATORY RESULTS:  During the course of hospitalization indicated a CBC with WBCs of 6.4, hemoglobin 16.3, hematocrit 46.0, platelets of 94,000.  UA unremarkable.  Chem profile showed unremarkable findings.  Liver function test showed ALT of 272, AST of 191.  Lipid panel unremarkable.  Toxicology showed positive benzodiazepines.  Thyroid function tests showed a TSH of 0.46.    DISCHARGE DISPOSITION:  The patient insists on leaving against medical advice.  He currently denies any suicidal or homicidal ideas, and there is no evidence of psychosis, and no justification for forcing a commitment.  Again, he was cautioned at length about the risk of leaving.  The patient is able to make such a decision.  Therefore, he is going against medical advice.  Due to this, he is receiving no followup.  Overall, prognosis would be considered quite poor.      Elizebeth BrookingSCOTT Trevelle Mcgurn, M.D.       SL/DN  D: 02/18/2016 08:55  T: 02/18/2016 15:47  JOB #: [id number]

## 2016-02-18 NOTE — Behavioral Health Treatment Team (Signed)
Dr Micah NoelLance ordered patient be released AMA. Pt advised of dangers of leaving the unit without completing program such as relapse, seizure or even death. Pt refused to stay. Pt left unit walking and voiced no complaints.

## 2016-02-18 NOTE — Behavioral Health Treatment Team (Signed)
[facility]  INPATIENT BEHAVIORAL HEALTH  NURSING PROGRESS NOTE  [name] [name]  02/18/2016; 0500      BEHAVIORAL ASSESSMENT   Pt denies SI, Hi, and AVH. Depression a 4, anx 2, and pain 0.  Pt asked if he is on a hold and what is the length of stay for a typical pt.  Pt is eager to leave our facility and states that he will be living with his sister and going to meetings with his brother in law.    CIWA Scale: 3      Sleep Assessment  Hours nightly slept: 8  Difficulty Falling Asleep: No  Difficulty Staying Asleep: No      Appetite Assessment  Clinician's Observations (Be Descriptive): Pt received crackers and peanut butter for snack    SUICIDE RE-ASSESSMENT  In the past 12 hours have you had thoughts of hurting yourself No            Nurses objective of patients reported suicidal thoughts 0  (ex. Minimizing, denial active thoughts etc be descriptive with assessment)   On the nurses scale of 1-5 where does your patient rate 1= no suicidal ideations, 2= vague thoughts, 3= strong thoughts with no plan; 4= thoughts with plan low lethality high chance of rescue; 5= strong thoughts with plan and means to carry out plan high lethality low chance of rescue)      NURSING GROUP:    TOPIC OF GROUP, Spiritual  Leader of Group Maryelizabeth Kaufmannastor Phil  START TIME 1800  STOP TIME 1930  Patient Participation attended               INTERVENTIONS      Treatment Plan Problem List & Correlating Goals/Interventions    Problem # Problem    Alc Withdrawal    Depression    Anxiety    Tobacco Abuse           CLIENT RESPONSE TO TREATMENT    Describe Patient Response to Master Treatment Plan's Goals/Interventions: Progressing

## 2016-02-18 NOTE — Behavioral Health Treatment Team (Addendum)
"  I feel okay."  Patient tells me he wants to leave and doesn't want to stay in the hospital, believing he doesn't need detox because he feels okay.  Has no tremor and denies other s/s of w/d.  Reports he is uninterested in rehab and that his "brother in law" will help him.  He refused to cooperate with therapy regarding discharge planning  Nursing this am tells me that he says someone from [place] is going to pick him up. Therapy notes however indicate that he has a sister here whom he will stay with, which is what he tells me this morning.   Regardless he is refusing to stay in the hospital.    He is currently alert, shows no tremor, hygiene is okay, speech unremarkable and language intact.  Mood is "ok" affect is unremarkable if not somewhat disinterested.  Ox3  Denies si/hi/avh.  Associations and processes intact.  Insight poor.  Does not appear to be responding to internal stimuli.    Patient currently exhibits no signs or symptoms justifying civil commitment and can leave AMA if he wishes.  He was cautioned about almost certain relapse and possible seizures and even death from w/d if he leaves prior to completing detox and he was informed that the reason he is not currently experiencing much w/d is because he is receiving serax which he will not have if he leaves.  He was able to listen to the risks of leaving, repeat them back and expressed an informed understanding of risks.    Given the above, he can leave against medical advice.    Discharge summary done:  [phone]    Time spent on day of discharge greater than 31 minutes.

## 2016-02-18 NOTE — Behavioral Health Treatment Team (Incomplete)
Verbal shift change report given to Dale (oncoming nurse) by Josh Abrams, RN (offgoing nurse).  Report given with SBAR.

## 2016-02-18 NOTE — Discharge Summary (Signed)
[facility]    DISCHARGE SUMMARY    Name:[name] [name], [name] [name]  MR#: [id number]  DOB: 06/20/1985  ACCOUNT #: [id number]   ADMIT DATE: 02/16/2016  DISCHARGE DATE: 02/18/2016    DISCHARGING SERVICE:  Melessa Cowell, MD    LOCATION:  Behavioral Medicine     DISCHARGE TYPE:  Against medical advice.    DISCHARGING DIAGNOSES:  AXIS I:  Alcohol withdrawal, also alcohol use disorder, severe .  AXIS II:  Deferred.  AXIS III:  Hepatitis C, elevated liver enzymes.  AXIS IV: Moderate to severe, to include recent homelessness.    AXIS V:  About 50 at discharge.    DISCHARGE MENTAL STATUS EXAMINATION:  Shows alert, oriented Caucasian male.  Speech, dress, hygiene and behavior unremarkable.  Mood was "okay."  Affect was relatively indifferent to the evaluation and unremarkable.  He is oriented x3.  Intelligence in the average range.  He denied suicidal or homicidal ideas.  He denied auditory or visual hallucinations.  There is no evidence of delusions or psychosis.  Memory and concentration were intact.  Insight and judgment poor.  Fund of knowledge is adequate.    PRESENTING PROBLEM/HOSPITAL COURSE:  Please refer to my original H and P done 02/17/2016 for the presenting problem.  In brief, this gentleman presented to the hospital complaining of the need for alcohol detoxification.  He was noted to be in mild withdrawal at time of admission.  He also had been homeless, having used his time up in the local shelter.    The patient was placed in a protective environment in the hospital and started on a Serax protocol.  The patient had no significant issues while he was here.  On the day that he left, he announced that he was feeling better.  Therapy notes indicated that he had secured housing with a family member in the local area, and the patient no longer seemed interested in detox.  The patient was cautioned at length that he could have increasing levels of withdrawal, seizures or even die, and almost certainly would  relapse if he left.  The patient was able to hear this, repeat it back, and express an understanding of it.  The patient accepted the risk of leaving, and would absolutely not allow us to refer him to rehab, which he really needs to go to.  The patient expressed no other severe symptoms while he was here, and was in no apparent distress, alert and able to make such a decision at discharge.    LABORATORY RESULTS:  During the course of hospitalization indicated a CBC with WBCs of 6.4, hemoglobin 16.3, hematocrit 46.0, platelets of 94,000.  UA unremarkable.  Chem profile showed unremarkable findings.  Liver function test showed ALT of 272, AST of 191.  Lipid panel unremarkable.  Toxicology showed positive benzodiazepines.  Thyroid function tests showed a TSH of 0.46.    DISCHARGE DISPOSITION:  The patient insists on leaving against medical advice.  He currently denies any suicidal or homicidal ideas, and there is no evidence of psychosis, and no justification for forcing a commitment.  Again, he was cautioned at length about the risk of leaving.  The patient is able to make such a decision.  Therefore, he is going against medical advice.  Due to this, he is receiving no followup.  Overall, prognosis would be considered quite poor.      Waylan Busta, M.D.       SL/DN  D: 02/18/2016 08:55       T: 02/18/2016 15:47  JOB #: [id number]

## 2016-02-18 NOTE — Other (Signed)
Spirituality Group Note: Patient declined scheduled 9 AM voluntary worship session.

## 2018-02-26 ENCOUNTER — Inpatient Hospital Stay
Admit: 2018-02-26 | Discharge: 2018-02-26 | Disposition: A | Discharge status: Home / Self Care | Payer: MEDICAID | Attending: Emergency Medicine

## 2018-02-26 DIAGNOSIS — F1510 Other stimulant abuse, uncomplicated: Principal | ICD-10-CM

## 2018-02-26 LAB — URINE DRUG SCREEN
Amphetamine Screen, Urine: POSITIVE — AB
Barbiturate Screen, Ur: NEGATIVE (ref <200)
Benzodiazepine Screen, Urine: NEGATIVE (ref <200)
Cannabinoid Scrn, Ur: NEGATIVE (ref <50)
Cocaine Metabolite Screen, Urine: NEGATIVE (ref <300)
Methadone Screen, Urine: NEGATIVE (ref <300)
Opiate Scrn, Ur: POSITIVE — AB (ref <300)
Oxycodone Urine: NEGATIVE (ref <100)
PCP Screen, Urine: NEGATIVE (ref <25)
Propoxyphene Scrn, Ur: NEGATIVE (ref <300)
pH, UA: 6

## 2018-02-26 LAB — CBC WITH AUTO DIFFERENTIAL
Basophils %: 0.7 %
Basophils Absolute: 0.1 10*3/uL (ref 0.0–0.2)
Eosinophils %: 0.5 %
Eosinophils Absolute: 0.1 10*3/uL (ref 0.0–0.6)
Hematocrit: 48.6 % (ref 40.5–52.5)
Hemoglobin: 16.1 g/dL (ref 13.5–17.5)
Lymphocytes %: 9.9 %
Lymphocytes Absolute: 1.4 10*3/uL (ref 1.0–5.1)
MCH: 32.2 pg (ref 26.0–34.0)
MCHC: 33.2 g/dL (ref 31.0–36.0)
MCV: 96.9 fL (ref 80.0–100.0)
MPV: 10.1 fL (ref 5.0–10.5)
Monocytes %: 8.1 %
Monocytes Absolute: 1.1 10*3/uL (ref 0.0–1.3)
Neutrophils %: 80.8 %
Neutrophils Absolute: 11.4 10*3/uL — ABNORMAL HIGH (ref 1.7–7.7)
Platelets: 144 10*3/uL (ref 135–450)
RBC: 5.01 M/uL (ref 4.20–5.90)
RDW: 14 % (ref 12.4–15.4)
WBC: 14.1 10*3/uL — ABNORMAL HIGH (ref 4.0–11.0)

## 2018-02-26 LAB — COMPREHENSIVE METABOLIC PANEL W/ REFLEX TO MG FOR LOW K
ALT: 196 U/L — ABNORMAL HIGH (ref 10–40)
AST: 80 U/L — ABNORMAL HIGH (ref 15–37)
Albumin/Globulin Ratio: 1.7 (ref 1.1–2.2)
Albumin: 5.1 g/dL — ABNORMAL HIGH (ref 3.4–5.0)
Alkaline Phosphatase: 72 U/L (ref 40–129)
Anion Gap: 14 (ref 3–16)
BUN: 17 mg/dL (ref 7–20)
CO2: 22 mmol/L (ref 21–32)
Calcium: 10 mg/dL (ref 8.3–10.6)
Chloride: 104 mmol/L (ref 99–110)
Creatinine: 0.9 mg/dL (ref 0.9–1.3)
GFR African American: >60 (ref >60)
GFR Non-African American: >60 (ref >60)
Globulin: 3 g/dL
Glucose: 118 mg/dL — ABNORMAL HIGH (ref 70–99)
Potassium reflex Magnesium: 4.4 mmol/L (ref 3.5–5.1)
Sodium: 140 mmol/L (ref 136–145)
Total Bilirubin: 0.8 mg/dL (ref 0.0–1.0)
Total Protein: 8.1 g/dL (ref 6.4–8.2)

## 2018-02-26 LAB — URINALYSIS WITH REFLEX TO CULTURE
Glucose, Ur: NEGATIVE mg/dL
Leukocyte Esterase, Urine: NEGATIVE
Nitrite, Urine: NEGATIVE
Protein, UA: 30 mg/dL — AB
Specific Gravity, UA: >=1.03 (ref 1.005–1.030)
Urobilinogen, Urine: 1 E.U./dL (ref <2.0)
pH, UA: 6 (ref 5.0–8.0)

## 2018-02-26 LAB — MICROSCOPIC URINALYSIS

## 2018-02-26 MED ORDER — ACETAMINOPHEN 325 MG PO TABS
325 MG | Freq: Once | ORAL | Status: AC
Start: 2018-02-26 — End: 2018-02-26
  Administered 2018-02-26: 19:00:00 650 mg via ORAL

## 2018-02-26 MED ORDER — SODIUM CHLORIDE 0.9 % IV BOLUS
0.9 | Freq: Once | INTRAVENOUS | Status: AC
Start: 2018-02-26 — End: 2018-02-26
  Administered 2018-02-26: 12:00:00 1000 mL via INTRAVENOUS

## 2018-02-26 MED ORDER — LORAZEPAM 2 MG/ML IJ SOLN
2 MG/ML | Freq: Once | INTRAMUSCULAR | Status: AC
Start: 2018-02-26 — End: 2018-02-26
  Administered 2018-02-26: 12:00:00 2 mg via INTRAVENOUS

## 2018-02-26 MED FILL — LORAZEPAM 2 MG/ML IJ SOLN: 2 mg/mL | INTRAMUSCULAR | Qty: 1

## 2018-02-26 MED FILL — ACETAMINOPHEN 325 MG PO TABS: 325 mg | ORAL | Qty: 2

## 2018-02-26 NOTE — ED Notes (Signed)
POST FALL MANAGEMENT      Details     Was the Fall Witnessed:  Yes     Brief Review of Event:Saline infusion complete.  Patient was awakened for reassessment.  Patient sitting on edge of bed with rn standing next to him taking blood pressure when patient suddenly fell forward onto floor hitting head on base of bedside table.    Date Fall Occurred: 02/26/2018     Time Fall Occurred: 0750   Assessment     Post Fall Head to Toe Assessment Completed: Yes     Post Edyth Gunnels Score Completed: Yes     Is the Score >20:  yes    Fall Precautions Initiated: Loraine Leriche only those that apply  [x]  Fall Bracelet  []  Non-Skid Socks  []  Dome Light  [x]  Fall Risk Sign  [x]  Bed Alarm    Injury Occurred:  no    Did the Patient Experience:( Mark all that apply)    []  Loss of consciousness  []  Change in mental status following the fall  []  Injuries exceeding minor hematoma and lacerations (restrictions in mobility, joint             range of motion or change in weight bearing status)  []  Patient is on an anticoagulant medication  []  Unknown if patient hit head    Diagnostics Ordered:  No:     Follow-up     Persons Notified of Fall:  Loraine Leriche all that apply and provide names of persons notified)   [x]  Provider: Dr. Selena Batten at bedside  [x]  Charge Nurse:  [x]  House Supervisior:  [x]  Manager:  []  Family:  []  Other:            Electronically signed by Naaman Plummer, RN 02/26/2018 at 8:01 AM       Naaman Plummer, RN  02/26/18 [phone]

## 2018-02-26 NOTE — ED Notes (Signed)
Pt presents with c/o anxiety due to meth use. Pt last used around 3 am; he is here to receive help with detoxing. Vital signs are stable, with tachycardia     Jayme Cloud, RN  02/26/18 [phone]

## 2018-02-26 NOTE — ED Notes (Addendum)
[facility] Member Consult-    Spoke with Pt about tx. Pt agreed with treatment through [facility]. A [facility] was called [facility]. Pt was put into lyft at 2:20 pm.          Odessa Fleming  02/26/18 1424       Odessa Fleming  02/26/18 1424

## 2018-02-26 NOTE — Other (Signed)
Result reviewed, no further treatment needed.

## 2018-02-26 NOTE — ED Provider Notes (Signed)
eMERGENCY dEPARTMENT eNCOUnter      Pt Name: Jesse Padilla  MRN: [id number]  Birthdate 10/31/1985  Date of evaluation: 02/26/2018  Provider: Nolon Lennert, MD     CHIEF COMPLAINT       Chief Complaint   Patient presents with   ??? Anxiety     Pt used meth at 3 am and wants help to stop          HISTORY OF PRESENT ILLNESS   (Location/Symptom, Timing/Onset,Context/Setting, Quality, Duration, Modifying Factors, Severity) Note limiting factors.   HPI    Jesse Padilla is a 33 y.o. male who presents to the emergency department via EMS for anxiety.  Patient apparently has been using meth since 3 AM.  He states that he has been using Meth for the past couple days.  Patient has IV track marks on both antecubital fossa.  There has been no fever.  Patient is very agitated.  He is picking his face and skin and is very fidgety.  States he is from [place].  Patient does not know how he got here in [place].  He has just been using and has a long history of drug abuse.  Patient states he wants help and wants to stop.    Nursing Notes were reviewed.    REVIEW OFSYSTEMS    (2+ for level 4; 10+ for level 5)   Review of Systems    Unable to obtain secondary to his agitation at this time.      PAST MEDICAL HISTORY   History reviewed. No pertinent past medical history.    SURGICAL HISTORY     History reviewed. No pertinent surgical history.    CURRENT MEDICATIONS       There are no discharge medications for this patient.      ALLERGIES     Patient has no known allergies.    FAMILY HISTORY     History reviewed. No pertinent family history.     SOCIAL HISTORY       Social History     Socioeconomic History   ??? Marital status: Single     Spouse name: None   ??? Number of children: None   ??? Years of education: None   ??? Highest education level: None   Occupational History   ??? None   Social Needs   ??? Financial resource strain: None   ??? Food insecurity:     Worry: None     Inability: None   ??? Transportation needs:     Medical: None      Non-medical: None   Tobacco Use   ??? Smoking status: Current Every Day Smoker     Packs/day: 1.00     Types: Cigars   Substance and Sexual Activity   ??? Alcohol use: Yes     Comment: occasionally   ??? Drug use: Yes     Types: Methamphetamines, IV   ??? Sexual activity: None   Lifestyle   ??? Physical activity:     Days per week: None     Minutes per session: None   ??? Stress: None   Relationships   ??? Social connections:     Talks on phone: None     Gets together: None     Attends religious service: None     Active member of club or organization: None     Attends meetings of clubs or organizations: None     Relationship status: None   ??? Intimate partner  violence:     Fear of current or ex partner: None     Emotionally abused: None     Physically abused: None     Forced sexual activity: None   Other Topics Concern   ??? None   Social History Narrative   ??? None       SCREENINGS    Glasgow Coma Scale  Eye Opening: Spontaneous  Best Verbal Response: Oriented  Best Motor Response: Obeys commands  Glasgow Coma Scale Score: 15      PHYSICAL EXAM    (up to 7 for level 4, 8 or more for level 5)     ED Triage Vitals [02/26/18 0553]   BP Temp Temp Source Pulse Resp SpO2 Height Weight   138/89 98.9 ??F (37.2 ??C) Oral 118 24 97 % 5\' 8"  (1.727 m) 150 lb (68 kg)       Physical Exam    General: Alert and awake.  Nontoxic appearance.  Thin white male who looks highly intoxicated with methamphetamine.  HEENT: Normocephalic atraumatic.  His pupils are very dilated but they are equal and sluggish to react.  Neck is supple.  Airway intact.  No adenopathy  Cardiac: Rapid rate and regular rhythm with no murmurs rubs or gallops  Pulmonary: Lungs are clear in all lung fields.  No wheezing.  No Rales.  Abdomen: Soft and nontender.  Negative hepatosplenomegaly.  Bowel sounds are active  Extremities: Moving all extremities.  No calf tenderness.  Peripheral pulses all intact  Skin: IV track marks noted.  Some scratches from his fidgeting  behavior.  Neurologic:  Nonfocal neurological exam but definitely show signs of intoxication from drugs.  He is unable to hold still at this time.  Is chronically moving his hands.  Psychiatric: Patient is appropriately cooperative at this time but appears agitated and sometimes uncontrollable behavior and movements.        DIAGNOSTIC RESULTS     EKG (Per Emergency Physician):       RADIOLOGY (Per Emergency Physician):       Interpretation per the Radiologist below, if available at the time of this note:  No results found.    ED BEDSIDE ULTRASOUND:   Performed by ED Physician - none    LABS:  Labs Reviewed   CBC WITH AUTO DIFFERENTIAL - Abnormal; Notable for the following components:       Result Value    WBC 14.1 (*)     Neutrophils Absolute 11.4 (*)     All other components within normal limits    Narrative:     Performed at:  [facility]  [address] [place], [place] [address]   Phone [phone]   COMPREHENSIVE METABOLIC PANEL W/ REFLEX TO MG FOR LOW K - Abnormal; Notable for the following components:    Glucose 118 (*)     Alb 5.1 (*)     ALT 196 (*)     AST 80 (*)     All other components within normal limits    Narrative:     Performed at:  [facility]  [address] [place], [place] [address]   Phone [phone]   URINE RT REFLEX TO CULTURE - Abnormal; Notable for the following components:    Color, UA DARK YELLOW (*)     Bilirubin Urine SMALL (*)     Ketones, Urine TRACE (*)  Blood, Urine TRACE-INTACT (*)     Protein, UA 30 (*)     All other components within normal limits    Narrative:     Performed at:  [facility]  [address]  [place], [place] [id number]   Phone [phone]   URINE DRUG SCREEN - Abnormal; Notable for the following components:    Amphetamine Screen, Urine POSITIVE (*)[facility]     Opiate Scrn, Ur POSITIVE (*)     All other components within normal limits     Narrative:     Performed at:  [facility]  [address]  [place], [place] [id number]   Phone [phone]   MICROSCOPIC URINALYSIS        All other labs were within normal range or not returned as of this dictation.      Procedures      EMERGENCY DEPARTMENT COURSE and DIFFERENTIAL DIAGNOSIS/MDM:   Vitals:    Vitals:    02/26/18 1100 02/26/18 1249 02/26/18 1300 02/26/18 1400   BP: (!) 143/73 111/69 119/78 126/80   Pulse: 100 93 78 76   Resp: 14 16 14 14    Temp:       TempSrc:       SpO2: 98% 99% 98% 97%   Weight:       Height:           Medications   0.9 % sodium chloride bolus (0 mLs Intravenous Stopped 02/26/18 0759)   LORazepam (ATIVAN) injection 2 mg (2 mg Intravenous Given 02/26/18 0649)   acetaminophen (TYLENOL) tablet 650 mg (650 mg Oral Given 02/26/18 1413)       MDM.    This is a 33 year old drug addict who is been using meth for couple days.  Patient states he is from [place].  Very agitated with pupils dilated.  Patient will get IV fluids and IV Ativan for control of his agitation.  May need to observe him in the ED until the drugs wears off.  Patient will be turned over to Dr Selena BattenKim at the end of my shift.  Please refer to Dr. Elmyra RicksKim's disposition.      REVAL:         CRITICAL CARE TIME   Total CriticalCare time was 0 minutes, excluding separately reportable procedures.  There was a high probability of clinically significant/life threatening deterioration in the patient's condition which required my urgent intervention.     CONSULTS:  None    PROCEDURES:  Unless otherwise noted below, none     @PROCDOC @    FINAL IMPRESSION      1. Methamphetamine abuse (HCC)          DISPOSITION/PLAN   DISPOSITION        PATIENT REFERRED TO:  No follow-up provider specified.    DISCHARGE MEDICATIONS:  There are no discharge medications for this patient.         (Please note:  Portions of this note were completed with a voice recognition program.Efforts were made to edit the dictations  but occasionally words and phrases are mis-transcribed.)  Form v2016.J.[id number]    Izell CarolinaPHILIP C Vernard Gram, MD (electronically signed)  Emergency Medicine Provider        Nolon LennertPhilip C Anders Hohmann, MD  03/02/18 [phone]

## 2018-02-26 NOTE — ED Notes (Signed)
Addiction Navigator here to talking with patient.     Naaman Plummer, RN  02/26/18 1409

## 2018-02-26 NOTE — ED Notes (Signed)
Notified Mudlogger at [facility] of fall.  Notified Jamelle Rushing ED manager of fall.     Naaman Plummer, RN  02/26/18 [phone]

## 2018-02-26 NOTE — ED Notes (Signed)
Patient opens eyes to name then squeezes eyes shut when asked to open them.  Patient mumbling when asked to follow commands.       Naaman Plummer, RN  02/26/18 1201

## 2018-02-26 NOTE — ED Notes (Signed)
Patient awakens easily to name.  Alert and oriented to person, place and day.  Aware that he is awaiting an addiction navigator to come and see him.       Naaman Plummer, RN  02/26/18 1248

## 2018-02-26 NOTE — ED Notes (Signed)
Patient agreeable to inpatient detox set up by addiction navigator.  [employer] called to transport patient.  AVS reviewed with patient.  Verbalized understanding.   AVS was printed and given to patient.  Patient accompanied to lobby by addiction navigator.     Naaman Plummer, RN  02/26/18 1420

## 2018-02-26 NOTE — ED Notes (Signed)
Pt remains cooperative with care. Currently sitting at the edge of bed. States it is hard to relax. 2 mg Ativan administered.     Jayme Cloud, RN  02/26/18 [phone]

## 2018-02-26 NOTE — ED Provider Notes (Addendum)
2:01 AM: I discussed the history, physical examination, laboratory and imaging studies, and treatment plan with Dr. Shayne Alken. Jesse Padilla was signed out to me in stable condition. Please see Dr. Mellody Life documentation for details of their history, physical, and laboratory studies.  Upon re-examination, a summary of Jesse Padilla's history, physical examination, and studies are as follows:      -Per handoff to Dr. Shayne Alken, patient admits to meth use several hours ago and comes in anxious requesting help to detox and to stop drug use.  On arrival patient afebrile but tachycardic at 118. Patient waiting for lab work, was given 1 L IV fluid bolus and 2mg  ativan.  Recommends social care services for referrals for detox centers.  -at [phone] patient was getting vitals reassessed when he was sitting at the edge of the bed and slid off onto the floor, likely due to somnolence for th 2mg  ativan that was given earlier by Dr. Shayne Alken.  No LOC observed, denies any pain.   -Patient was reassessed several times throughout ED stay by ED nurse, he is found to be sleeping but easily arousable, follows commands, moving all four extremities, denies pain, states he's here for detox  -Finally reassessment at 2pm patient alert, still laying in bed, states that he taken phentermine and it still interested in going to detox center.  Requests something for headache, was given Tylenol.  This was able to obtain detox treatment in [place] which is where patient is from.  Updated patient on possible admission to detox center in [place] which he agrees with.  -Well appearing, non toxic, alert, oriented speaking in full sentences and hemodynamically stable upon discharge      Labs Reviewed   CBC WITH AUTO DIFFERENTIAL - Abnormal; Notable for the following components:       Result Value    WBC 14.1 (*)     Neutrophils Absolute 11.4 (*)     All other components within normal limits    Narrative:     Performed at:  [facility]  [address] [place], [place] [address]   Phone [phone]   COMPREHENSIVE METABOLIC PANEL W/ REFLEX TO MG FOR LOW K - Abnormal; Notable for the following components:    Glucose 118 (*)     Alb 5.1 (*)     ALT 196 (*)     AST 80 (*)     All other components within normal limits    Narrative:     Performed at:  [facility]  [address] [place], [place] [address]   Phone [phone]   URINE RT REFLEX TO CULTURE - Abnormal; Notable for the following components:    Color, UA DARK YELLOW (*)     Bilirubin Urine SMALL (*)     Ketones, Urine TRACE (*)     Blood, Urine TRACE-INTACT (*)     Protein, UA 30 (*)     All other components within normal limits    Narrative:     Performed at:  [facility]  [address] [place], [place] [address]   Phone [phone]   URINE DRUG SCREEN - Abnormal; Notable for the following components:    Amphetamine Screen, Urine POSITIVE (*)     Opiate Scrn, Ur POSITIVE (*)     All other components within normal limits    Narrative:  Performed at:  [facility]  [address] [place], [place] [address]   Phone [phone]   MICROSCOPIC URINALYSIS     No orders to display     1. Methamphetamine abuse (HCC)           Gweneth Dimitri, MD  02/26/18 1417       Gweneth Dimitri, MD  03/06/18 8333

## 2018-07-11 NOTE — ED Notes (Signed)
Formatting of this note might be different from the original.  Discharged per Gerarda Gunther RN   Electronically signed by Corrie Dandy, RN at 07/11/2018  5:53 PM EDT

## 2018-07-11 NOTE — ED Notes (Signed)
Formatting of this note might be different from the original.  Pt given water per his request.   Electronically signed by Corrie Dandy, RN at 07/11/2018  4:01 PM EDT

## 2018-07-11 NOTE — ED Notes (Signed)
Formatting of this note might be different from the original.  ED Hourly rounding completed at this time. Patient is awake. Patient reports pain 0/10. Currently, patient has no one at bedside. Updated the patient on plan of care including pending ED results and disposition status - of discharge .  Call light at bedside and patient demonstrated usage.   Electronically signed by Corrie Dandy, RN at 07/11/2018  5:53 PM EDT

## 2018-07-11 NOTE — ED Notes (Signed)
Formatting of this note might be different from the original.  ED Hourly rounding completed at this time. Patient is awake. Patient reports pain 0/10. Currently, patient has no one at bedside. Updated the patient on plan of care including pending ED results and disposition status - of discharge .  Call light at bedside and patient demonstrated usage.   Electronically signed by Corrie Dandy, RN at 07/11/2018  4:28 PM EDT

## 2018-07-11 NOTE — ED Notes (Signed)
Formatting of this note might be different from the original.  Pt requested information on addiction help after he relapsed. Pt states that he went to rehab in [facility] and has been clean for 2 years. One week ago his girlfriend relapsed and it caused him to relapse. He has been snorting meth everyday for a week. He denies any SI/HI or hallucinations. He does state that he is feeling paranoid but he did use meth only an hour ago. SW provided him with information on various programs in the state and explained what the different places offered. He was advised to call one of the facilities to obtain a bed for rehab. He was advised to return to the ED or call the crisis hotline number should his symptoms worsen. The patient voiced understanding.  It was discussed with attending physician, Micheline Maze, who agreed. No further social services interventions were identified. Please contact SW should any needs/concerns arise, otherwise, discharge the patient when medically cleared to the appropriate setting.    Layne Benton, MSW, CSW  ED Social Worker  Electronically signed by Layne Benton at 07/11/2018  5:04 PM EDT

## 2018-07-11 NOTE — ED Notes (Signed)
Formatting of this note might be different from the original.  Pt arrived to ED via [facility] c/o addiction problem. Pt states "I went to a rehab in [place] and I have been clean for two years. Today I relapsed and I snorted some meth about an hour ago. I am just feeling really bad and I want to get some help." Denies pain, Denies SI, HI. Tachycardia persent- EKG complete and given to MD. VSS, NAD noted at this time.   Electronically signed by Corrie Dandy, RN at 07/11/2018  3:27 PM EDT

## 2018-07-11 NOTE — ED Provider Notes (Signed)
Formatting of this note is different from the original.  Maralyn SagoBynum, Harjot L [[id number]][[id number]] [facility](M) - 33 y.o.  Note Creation:07/24/2018  Encounter Date:07/11/2018      History     Chief Complaint   Patient presents with   ? Addiction Problem     Meth      Pt is a 33 y.o. male with a hx of drug abuse who presents via EMS for help with addiction problem. He says the last time he used meth was an hour ago and this was a relapse after two years. He has no other complaints at this time, but just states He denies chest pain, chills, fever, nausea, emesis, SOB, or any other pertinent symptoms or concerns. He denies any other aggravating or alleviating factors. He denies any other pertinent PMHx.    Past Medical History:   Diagnosis Date   ? Hepatitis C      Past Surgical History:   Procedure Laterality Date   ? HX LITHOTRIPSY, ESWL       No family history on file.    Social History     Tobacco Use   ? Smoking status: Current Some Day Smoker     Packs/day: 1.00     Years: 15.00     Pack years: 15.00   ? Smokeless tobacco: Never Used   Substance Use Topics   ? Alcohol use: Yes     Alcohol/week: 120.0 standard drinks     Types: 6 Cans of beer per week   ? Drug use: Yes     Types: Methamphetamines     Patient is a tobacco user, and I have offered a counseling referral.    No LMP for male patient.    No Known Allergies    Current Discharge Medication List     Medication list as of:  07/11/2018  5:46 PM    There are no discharge medications for this patient.       Review of Systems     Review of Systems   Constitutional: Negative for activity change, appetite change, chills, fatigue, fever and unexpected weight change.   HENT: Negative for congestion, dental problem, ear discharge, ear pain, facial swelling, hearing loss, nosebleeds, postnasal drip, rhinorrhea, sinus pressure, sneezing, sore throat, tinnitus, trouble swallowing and voice change.    Eyes: Negative for photophobia, pain, discharge, redness and itching.   Respiratory: Negative for  cough, choking, chest tightness, shortness of breath, wheezing and stridor.    Cardiovascular: Negative for chest pain, palpitations and leg swelling.   Gastrointestinal: Negative for abdominal pain, constipation, diarrhea, nausea, rectal pain and vomiting.   Endocrine: Negative for cold intolerance, polydipsia, polyphagia and polyuria.   Genitourinary: Negative for decreased urine volume, difficulty urinating, dysuria, flank pain, frequency, hematuria and urgency.   Musculoskeletal: Negative for arthralgias, back pain, joint swelling and myalgias.   Skin: Negative for pallor, rash and wound.   Neurological: Negative for tremors, seizures, syncope, weakness, numbness and headaches.   Hematological: Negative for adenopathy.   Psychiatric/Behavioral: Negative for agitation, behavioral problems, confusion, hallucinations, self-injury, sleep disturbance and suicidal ideas. The patient is not nervous/anxious.      Physical Exam     ED Triage Vitals [07/11/18 1448]   BP BP Manual or Automatic? Patient Position BP Location Heart Rate (Monitor)   140/74 Automatic Sitting Left Arm --     Pulse Pulse Source Respirations Temp Temp src   (!) 132 -- 18 98 F (36.7 C) --  SpO2 SPO2 Location O2 Delivery O2 Device O2 Flow Rate (l/min)   98 % -- Room air -- --     FIO2 (%) Pain Intensity 1 Exacerbated By Relieved By Quality   -- -- -- -- --     Duration       --         Physical Exam  Vitals signs and nursing note reviewed.   Constitutional:       General: He is not in acute distress.     Appearance: He is well-developed. He is not diaphoretic.   HENT:      Head: Normocephalic and atraumatic.      Nose: Nose normal.      Mouth/Throat:      Pharynx: No oropharyngeal exudate.   Eyes:      General: No scleral icterus.        Left eye: No discharge.      Conjunctiva/sclera: Conjunctivae normal.      Pupils: Pupils are equal, round, and reactive to light.   Neck:      Musculoskeletal: Normal range of motion and neck supple.       Vascular: No JVD.   Cardiovascular:      Rate and Rhythm: Normal rate and regular rhythm.      Heart sounds: Normal heart sounds. No murmur. No friction rub. No gallop.    Pulmonary:      Effort: Pulmonary effort is normal. No respiratory distress.      Breath sounds: Normal breath sounds. No stridor. No wheezing or rales.   Chest:      Chest wall: No tenderness.   Abdominal:      General: Bowel sounds are normal. There is no distension.      Palpations: Abdomen is soft.      Tenderness: There is no tenderness. There is no guarding or rebound.   Musculoskeletal: Normal range of motion.         General: No tenderness or deformity.   Skin:     General: Skin is warm and dry.      Coloration: Skin is not pale.      Findings: No erythema or rash.   Neurological:      Mental Status: He is alert and oriented to person, place, and time.      Cranial Nerves: No cranial nerve deficit.   Psychiatric:         Behavior: Behavior normal.         Thought Content: Thought content normal.         Judgment: Judgment normal.     MDM     Treatment:  Procedures    Medications - No data to display    Results for orders placed or performed during the hospital encounter of 07/11/18   12 Lead EKG - ED (Initial EKG)   Result Value Ref Range    VENTRICULAR RATE EKG 106 /min    ECG RR INTERVAL 561 ms    ECG P DURATION 91 ms    ECG QRS DURATION 87 ms    ECG PR INTERVAL 110 ms    ECG QT INTERVAL 286 ms    ECG QTC INTERVAL 366 ms    Q-T DISPERSION  ms    ECG P AXIS 64 deg    ECG QRS AXIS 54 deg    ECG T AXIS         Results       12 Lead EKG -  ED (Initial EKG) (Final result)      Collection Time Result Time VENTRICULAR RATE EKG ECG RR INTERVAL ECG P DURATION ECG QRS DURATION ECG PR INTERVAL ECG QT INTERVAL ECG QTC INTERVAL Q-T DISPERSION ECG P AXIS ECG QRS AXIS    07/11/18 14:57:30 07/11/18 15:14:26 106 561 91 87 110 286 366  64 54      Collection Time Result Time ECG T AXIS    07/11/18 14:57:30 07/11/18 15:14:26           Final result             Narrative:    SINUS TACHYCARDIA WITH SHORT PR INTERVAL  NONSPECIFIC T-WAVE ABNORMALITY  ABNORMAL RHYTHM ECG    NO PREVIOUS TRACING            Preliminary result            Narrative:    SINUS TACHYCARDIA WITH SHORT PR INTERVAL  NONSPECIFIC T-WAVE ABNORMALITY  ABNORMAL RHYTHM ECG    NO PREVIOUS TRACING                   Plan:    [facility] ED RECHECK: Discharge: The pt is awake, alert and well appearing at time of reevaluation. I spoke with the patient about his ED work up, diagnosis and any further treatment. I discussed the importance of following up with his PCP. I instructed him to return to the Emergency Room for new or worsening symptoms. All of the pt's questions were answered. The patient verbalized understanding. The patient is stable at time of discharge.    I discussed pt case with social work    MDM  Number of Diagnoses or Management Options    Amount and/or Complexity of Data Reviewed  Decide to obtain previous medical records or to obtain history from someone other than the patient: yes  Review and summarize past medical records: yes  Independent visualization of images, tracings, or specimens: yes    Risk of Complications, Morbidity, and/or Mortality  Presenting problems: moderate  Diagnostic procedures: moderate  Management options: moderate    Patient Progress  Patient progress: stable      Progress Note:  EKG seen by me:  Date & Time: 07/24/18 & 1457  Interpretation: no stemi          ED Prescriptions     None       Final diagnoses:   Methamphetamine use       ED Disposition     ED Disposition Condition Comment    Discharge After Treatment Stable        Discharge Instructions      Follow SW plan, stop using drugs     Scribe Attestation:  Mallory Wells acting as scribe for and in the presence of Ackerly, Matthews, DO.    Electronically Signed By Koleen Distance 07/11/2018 3:44 PM    Provider Attestation:  pI personally performed the services described in the documentation, reviewed the documentation recorded  by the scribe in my presence and it accurately and completely records my words and actions.   Electronically Signed By Madilyn Hook, DO 07/24/2018 10:58 AM    Electronically signed by Madilyn Hook, DO at 07/24/2018 10:58 AM EDT

## 2018-07-13 NOTE — ED Notes (Signed)
Formatting of this note might be different from the original.  ED Hourly rounding completed at this time. Patient is resting. Patient reports pain 0/10. Currently, patient has 1:1 sitter at bedside. Updated the patient on plan of care including pending ED results and disposition status - pending further results .  Call light at bedside and patient demonstrated usage.   Electronically signed by Rickey Primus, RN at 07/13/2018  6:30 AM EDT

## 2018-07-13 NOTE — ED Provider Notes (Signed)
Formatting of this note is different from the original.  Jesse SagoBynum, Md L [[id number]][[id number]] [facility](M) - 33 y.o.  Note Creation:07/13/2018  Encounter Date:07/13/2018      History     Chief Complaint   Patient presents with   ? Mental Health Problem     Jesse Padilla is a 33 y.o. male with hx of drug abuse and hepatitis C who [facility] for [facility] evaluation s/p agitated and hyperactive behavior that onset pta. Per police, [facility] called s/p the pt being outside for x4 hrs and refusing to come in. They expect drug use. Pt presents hyperventilating and handcuffed. Pt was seen at this facility x2 days ago for methamphetamine abuse s/p a relapse for x2 yrs. Hx is limited due to pt is nonverbal.    The history is provided by the EMS personnel.   Mental Health Problem   Presenting symptoms: agitation      Past Medical History:   Diagnosis Date   ? Hepatitis C      Past Surgical History:   Procedure Laterality Date   ? HX LITHOTRIPSY, ESWL       No family history on file.    Social History     Tobacco Use   ? Smoking status: Current Some Day Smoker     Packs/day: 1.00     Years: 15.00     Pack years: 15.00   ? Smokeless tobacco: Never Used   Substance Use Topics   ? Alcohol use: Yes     Alcohol/week: 120.0 standard drinks     Types: 6 Cans of beer per week   ? Drug use: Yes     Types: Methamphetamines     Patient is a tobacco user, and I have offered a counseling referral.    No LMP for male patient.    No Known Allergies    Home Meds     No Medications Reported       Review of Systems     Review of Systems   Unable to perform ROS: Acuity of condition   Constitutional: Negative for fever.   Respiratory: Negative for wheezing.    Cardiovascular: Negative for leg swelling.   Gastrointestinal: Negative for vomiting.   Skin: Negative for color change, rash and wound.   Psychiatric/Behavioral: Positive for agitation and behavioral problems.     Physical Exam     ED Triage Vitals [07/13/18 0505]   BP BP Manual or  Automatic? Patient Position BP Location Heart Rate (Monitor)   (!) 156/98 Automatic Sitting Left Arm 112     Pulse Pulse Source Respirations Temp Temp Source   (!) 112 -- 20 98.6 F (37 C) Oral     SpO2 SPO2 Location O2 Delivery O2 Device O2 Flow Rate (l/min)   98 % -- Room air -- --     FIO2 (%) Pain Intensity 1 Exacerbated By Relieved By Quality   -- -- -- -- --     Duration       --         Physical Exam  Vitals signs and nursing note reviewed.   Constitutional:       General: He is not in acute distress.     Appearance: He is well-developed. He is not diaphoretic.   HENT:      Head: Normocephalic and atraumatic.   Eyes:      General:         Right eye:  No discharge.         Left eye: No discharge.      Pupils: Pupils are equal, round, and reactive to light.   Neck:      Musculoskeletal: Normal range of motion and neck supple.   Cardiovascular:      Rate and Rhythm: Normal rate and regular rhythm.      Heart sounds: Normal heart sounds. No murmur. No friction rub. No gallop.    Pulmonary:      Effort: Pulmonary effort is normal. No respiratory distress.      Breath sounds: Normal breath sounds.   Abdominal:      General: There is no distension.      Palpations: Abdomen is soft.      Tenderness: There is no tenderness.   Musculoskeletal:         General: No deformity.   Skin:     General: Skin is warm and dry.      Findings: No erythema or rash.   Neurological:      Mental Status: He is alert.   Psychiatric:         Attention and Perception: He is inattentive.         Mood and Affect: Mood is anxious.         Behavior: Behavior is hyperactive.     MDM     Treatment:  Procedures    Medications   haloperidol lactate (HALDOL) injection 5 mg (5 mg Intramuscular Given 07/13/18 0422)   diphenhydrAMINE (BENADRYL) injection 50 mg (50 mg Intramuscular Given 07/13/18 0429)   LORazepam (ATIVAN) injection 2 mg (2 mg Intramuscular Given 07/13/18 0437)     Results for orders placed or performed during the hospital encounter of  07/13/18   Acetaminophen Level   Result Value Ref Range    ACETAMINOPHEN <10.0 ug/mL   CBC   Result Value Ref Range    WBC 6.1 4.5 - 11.0 10*3/uL    RBC 4.76 4.50 - 5.90 10*6/uL    HGB 15.5 13.5 - 17.5 g/dL    HCT 44.8 37.0 - 53.0 %    MCV 94.1 80.0 - 100.0 fL    MCHC 34.7 32.0 - 36.0 g/dL    MCH 32.7 26.0 - 34.0 pg    RDW 13.3 (H) 11.5 - 13.1 %    MPV 10.4 (H) 6.5 - 10.0 fL    Platelet Cnt 98 (L) 150 - 450 10*3/uL    Differential Type Auto     Neutrophils 61.0 35.0 - 66.0 %    Lymphocytes 28.8 24.0 - 44.0 %    Monocytes 8.7 2.1 - 13.3 %    Eosinophils 0.8 0.3 - 5.0 %    Basophils 0.7 0.0 - 1.0 %    Neutrophils Abs 3.7 1.5 - 8.5 10*3/uL    Lymphocytes Abs 1.7 1.1 - 5.0 10*3/uL    Monocytes Abs 0.5 0.0 - 1.4 10*3/uL    Eosinophils Abs 0.0 0.0 - 0.5 10*3/uL    Basophils Abs 0.0 0.0 - 0.1 10*3/uL   Comprehensive Metabolic Panel   Result Value Ref Range    SODIUM 143 135 - 145 mmol/L    POTASSIUM 3.5 (L) 3.6 - 5.0 mmol/L    CHLORIDE 110 101 - 111 mmol/L    CO2 22 21 - 31 mmol/L    ANION GAP 11     GLUCOSE 111 (H) 70 - 110    CREATININE 0.9 0.6 - 1.2    BUN 16  6 - 20    CALCIUM 9.0 8.5 - 10.5    PROTEIN TOTAL 6.9 6.7 - 8.2 g/dL    Albumin 4.3 3.2 - 5.0 g/dL    T BILIRUBIN 1.5 (H) 0.2 - 1.0    ALP 53 42 - 121 [iU]/L    AST 157 (H) 10 - 42 [iU]/L    ALT (SGPT) 198 (H) 10 - 60 [iU]/L    OSMOLALITY 287 266 - 309    A/G Ratio 1.7     B/C 18 10 - 20    ESTIMATED GFR >90 mL/min   Ethanol   Result Value Ref Range    ETHANOL 0 mg/dL   Salicylate   Result Value Ref Range    SALICYLATE <4.0 0.0 - 30.0   Rapid Tox Screen, Urine   Result Value Ref Range    CANNABINOID NEGATIVE Cutoff: 50 ng/mL    PHENCYCLIDINE NEGATIVE Cutoff: 25 ng/mL    COCAINE NEGATIVE Cutoff: 300 ng/mL    OPIATES NEGATIVE Cutoff: 300 ng/mL    FENTANYL NEGATIVE Cutoff: 200 ng/mL    BUPRENORPHINE NEGATIVE Cutoff:  5 ng/mL    AMPHETAMINES POSITIVE (A) Cutoff: 1000 ng/mL    METHAMPHETAMINE POSITIVE (A)     BENZODIAZEPINE NEGATIVE Cutoff: 200 ng/mL    TRICYCLICS  NEGATIVE Cutoff: 300 ng/mL    METHADONE NEGATIVE Cutoff: 300 ng/mL    BARBITURATES NEGATIVE Cutoff: 200 ng/mL    OXYCODONE NEGATIVE Cutoff: 300 ng/mL    PROPOXYPHENE NEGATIVE Cutoff: 300 ng/mL       Plan:    [facility] ED RECHECK: Discharge: The pt is awake, alert and well appearing at time of reevaluation. I spoke with the patient about his ED work up, diagnosis and any further treatment. I discussed the importance of following up with his PCP. I instructed him to return to the Emergency Room for new or worsening symptoms. All of the pt's questions were answered. The patient verbalized understanding. The patient is stable at time of discharge.    MDM  Number of Diagnoses or Management Options    Amount and/or Complexity of Data Reviewed  Clinical lab tests: ordered and reviewed  Decide to obtain previous medical records or to obtain history from someone other than the patient: yes  Obtain history from someone other than the patient: yes  Review and summarize past medical records: yes  Independent visualization of images, tracings, or specimens: yes    Risk of Complications, Morbidity, and/or Mortality  Presenting problems: moderate  Diagnostic procedures: moderate  Management options: moderate    Patient Progress  Patient progress: stable      Progress Note:        ED Prescriptions     None       Final diagnoses:   Methamphetamine-induced psychotic disorder       ED Disposition     ED Disposition Condition Comment    Discharge After Treatment Stable        Discharge Instructions      Please follow-up with your primary physician in the next 1-2 days or as soon as possible thereafter in order to ensure that your symptoms are improving and you are continuing to do well. Continue taking all home medications as prescribed. Return to the emergency department immediately for any new, worsening, or concerning symptoms.     Scribe Attestation:    Edwyna PerfectJessica Copley acting as scribe for and in the presence of Mayo, John,  DO    Electronically Signed By Edwyna PerfectJessica Copley  on 07/13/2018 at 4:28 AM    Provider Attestation:  I, Warren DanesJohn Mayo, DO, hereby attest that the medical record entry accurately reflects the services provided and the decisions made in my capacity as the treating physician. I do hereby attest that this information is true, accurate, and complete to the best of my knowledge, and I understand that any falsification, omission, or concealment of material fact may subject me to administrative, civil, or criminal liability.  Electronically Signed By Warren DanesJohn Mayo, DO 07/13/2018 6:14 AM    Electronically signed by Warren DanesMayo, John, DO at 07/13/2018  6:15 AM EDT

## 2018-07-13 NOTE — ED Notes (Signed)
Formatting of this note is different from the original.  Patient presents to ED from [facility] by [employer] [facility]. [facility] reports to ems they found him lying in fetal position out back of facility, he would not talk to anyone and wouldn't cooperate with [employer], reports he was also hearing voices and seeing things yesterday . Patient continues to be in fetal position, respirations deep and rapid at rate of 44, skin PWD, handcuffed at this time, patient not talking to staff and uncooperative. Unable to triage patient or obtain vitals.    Environmental safety scan completed. Items that could potentially pose a threat to patient or staff safety removed from the room and placed into a labeled container. Container placed at the nurses? station.    Electronically signed by Rickey Primus, RN at 07/18/2018  1:40 AM EDT

## 2018-07-13 NOTE — ED Notes (Signed)
Formatting of this note might be different from the original.  ED Hourly rounding completed at this time. Patient is resting. Patient reports pain 0/10. Currently, patient has no one at bedside. Updated the patient on plan of care including pending ED results and disposition status - of discharge .  Call light at bedside and patient demonstrated usage.   Electronically signed by Glenice Laine, RN at 07/13/2018  9:50 AM EDT

## 2018-07-13 NOTE — ED Notes (Signed)
Formatting of this note might be different from the original.  ED Hourly rounding completed at this time. Patient is awake. Patient reports pain 0/10. Currently, patient has no one at bedside. Updated the patient on plan of care including pending ED results and disposition status - of discharge .  Call light at bedside and patient demonstrated usage.   Electronically signed by Glenice Laine, RN at 07/13/2018 11:14 AM EDT

## 2018-07-13 NOTE — ED Notes (Signed)
Formatting of this note might be different from the original.  ED Hourly rounding completed at this time. Patient is resting. Patient reports pain 1/10. Currently, patient has 1:1 sitter at bedside. Updated the patient on plan of care including pending ED results and disposition status - pending further results .  Call light at bedside and patient demonstrated usage.   Electronically signed by Rickey Primus, RN at 07/13/2018  6:30 AM EDT

## 2018-07-13 NOTE — ED Notes (Signed)
Formatting of this note is different from the original.  Environmental safety scan completed. Items that could potentially pose a threat to patient or staff safety removed from the room and placed into a labeled container. Container placed at the nurses? station.    Electronically signed by Rickey Primus, RN at 07/18/2018  1:40 AM EDT

## 2018-07-13 NOTE — ED Notes (Signed)
Formatting of this note might be different from the original.  Called [facility] for pt a ride. Pt refused a vitals reassessment.   Electronically signed by Glenice Laine, RN at 07/13/2018 11:28 AM EDT

## 2018-07-13 NOTE — ED Notes (Signed)
Formatting of this note might be different from the original.  Bedside handoff given to [facility] RN  Electronically signed by Rickey Primus, RN at 07/13/2018  8:03 AM EDT

## 2022-01-14 DIAGNOSIS — J301 Allergic rhinitis due to pollen: Secondary | ICD-10-CM

## 2022-01-14 NOTE — Progress Notes (Signed)
Prescriptions called in to [facility] at this time per doctors request- Prednisone 40mg  PO Daily for 5 days and Benadryl 25mg  PO BID fro 3 days- no refills

## 2022-01-14 NOTE — ED Notes (Signed)
Discharge instructions reviewed with verbalized understanding from patient. Patient had no further questions or concerns.

## 2022-01-14 NOTE — ED Provider Notes (Signed)
[place] ENCOUNTER        Pt Name: Jesse Padilla  MRN: [id number]  [place] [birth year]  Date of evaluation: 01/14/2022  Provider: Moss Mc, DO  PCP: No primary care provider on file.  Note Started: 5:19 AM EST 01/15/22    CHIEF COMPLAINT       Chief Complaint   Patient presents with    Facial Swelling       HISTORY OF PRESENT ILLNESS: 1 or more Elements     History from : Patient    Limitations to history : None    Rucker Pridgeon is a 36 y.o. male who presents complaining of facial swelling.  Patient had claimed he went to the woods and when he came out from the woods he has isolated itching and facial swelling sets in.  Case discussed with the patient patient had likely a contact allergen causing allergy reaction while in the woods patient evaluated and seen and Benadryl and prednisone ordered and patient responded to this..  Patient did remarkably well    Nursing Notes were all reviewed and agreed with or any disagreements were addressed in the HPI.    REVIEW OF SYSTEMS :      Review of Systems    Review of systems reviewed and negative except as in HPI/MDM    PAST MEDICAL HISTORY   History reviewed. No pertinent past medical history.    SURGICAL HISTORY   History reviewed. No pertinent surgical history.    CURRENTMEDICATIONS     There are no discharge medications for this patient.      ALLERGIES     Patient has no known allergies.    FAMILYHISTORY     History reviewed. No pertinent family history.     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), IV       SCREENINGS        Glasgow Coma Scale  Eye Opening: Spontaneous  Best Verbal Response: Oriented  Best Motor Response: Obeys  commands  Glasgow Coma Scale Score: 15                CIWA Assessment  BP: (!) 144/85  Pulse: 83           PHYSICAL EXAM  1 or more Elements     ED Triage Vitals [01/14/22 2055]   BP Temp Temp Source Pulse Respirations SpO2 Height Weight   (!) 144/85 98.3 F (36.8 C) Oral 83 16 97 % -- --       My pulse oximetry interpretation is which is within the normal range    Physical Exam  General :Patient is awake, alert, oriented, in no acute distress, nontoxic appearing  HEENT: Pupils are equally round and reactive to light, EOMI, conjunctivae clear.  Oral mucosa is moist, no exudate. Uvula is midline  Neck: Neck is supple, full range of motion, trachea midline  Cardiac: Heart regular rate, rhythm, no murmurs, rubs, or gallops  Lungs: Lungs are clear to auscultation, there is no wheezing, rhonchi, or rales. There is  no use of accessory muscles.  Chest wall: There is no tenderness to palpation over the chest wall or over ribs  Abdomen: Abdomen is soft, nontender, nondistended. There is no firm or pulsatile masses, no rebound rigidity or guarding.   Musculoskeletal: 5 out of 5 strength in all 4 extremities.  No focal muscle deficits are appreciated  Neuro: Motor intact, sensory intact, level of consciousness is normal, cerebellar function is normal, reflexes are grossly normal. No evidence of incontinence or loss of bowel or bladder function, no saddle anesthesia noted   Dermatology: Skin is warm and dry  Psych: Mentation is grossly normal, cognition is grossly normal. Affect is appropriate.        DIAGNOSTIC RESULTS   LABS:    I have reviewed and interpreted all of the currently available lab results from this visit (ifapplicable):  No results found for this visit on 01/14/22.           EKG: None    RADIOLOGY:   Non-plain film images such as CT, Ultrasound and MRI are read by the radiologist. Plain radiographic images are visualized and preliminarily interpreted by the ED Provider with the below  findings:    None    Interpretation per the Radiologist below, if available at the time of this note:    No orders to display     No results found.    No results found.    PROCEDURES   Unless otherwise noted below, none     Procedures none    CRITICAL CARE TIME   None    EMERGENCY DEPARTMENT COURSE and DIFFERENTIAL DIAGNOSIS/MDM:   Vitals:    Vitals:    01/14/22 2055   BP: (!) 144/85   Pulse: 83   Resp: 16   Temp: 98.3 F (36.8 C)   TempSrc: Oral   SpO2: 97%       Patient was given the following medications:  Medications   diphenhydrAMINE (BENADRYL) capsule 25 mg (25 mg Oral Given 01/14/22 2250)   predniSONE (DELTASONE) tablet 40 mg (40 mg Oral Given 01/14/22 2251)            Is this patient to be included in the SEP-1 Core Measure due to severe sepsis or septic shock?   No   Exclusion criteria - the patient is NOT to be included for SEP-1 Core Measure due to:  2+ SIRS criteria are not met    PAST MEDICAL HISTORY      has no past medical history on file.     CC/HPI Summary, DDx, ED Course, and Reassessment:     Contact allergic reaction to the facial region       CONSULTS: (Who and What was discussed)  None    Discussion with Other Profesionals : None    Social Determinants : None    Chronic Conditions:     Records Reviewed : Source      Disposition Considerations  Patient discharged home for outpatient care patient to be on prednisone 40 mg p.o. daily x 5 days and Benadryl 25 mg twice daily x 3 days    I am the Primary Clinician of Record.    FINAL IMPRESSION      1. 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  Patient to follow-up with a primary  care doctor patient for post ED visit in 3 days    DISCHARGE MEDICATIONS:  There are no discharge medications for this patient.  Discharge medications are as follows-prednisone 40 mg 1 tablet p.o. daily x 5 days and then Benadryl 25 mg  p.o. twice daily x 3 days    DISCONTINUED MEDICATIONS:  There are no discharge medications for this patient.             (Please note that portions of this note were completed with a voice recognition program.  Efforts were made to edit the dictations but occasionally words are mis-transcribed.)    [place], DO (electronically signed)           Moss Mc, DO  01/15/22 [phone]

## 2022-01-14 NOTE — ED Notes (Signed)
Patient contacted at this time. Patient notified that MD wanted a prescription called in to pharmacy. Pt elected to have them called in to [facility]. Patient told to take benadryl over the counter until able to pick up script.    Prescription for prednisone 40mg  PO times 5 days and benadryl 25mg  BID times 3 days to be called in to patient pharmacy.

## 2022-01-14 NOTE — Discharge Instructions (Signed)
Patient to follow-up with a primary care doctor or a care provider for post ED visit within 3 days

## 2022-01-15 ENCOUNTER — Inpatient Hospital Stay
Admit: 2022-01-15 | Discharge: 2022-01-15 | Disposition: A | Discharge status: Home / Self Care | Payer: MEDICAID | Attending: Internal Medicine

## 2022-01-15 MED ORDER — DIPHENHYDRAMINE HCL 25 MG PO CAPS
25 MG | ORAL | Status: AC
Start: 2022-01-15 — End: 2022-01-14
  Administered 2022-01-15: 04:00:00 25 via ORAL

## 2022-01-15 MED ORDER — PREDNISONE 20 MG PO TABS
20 MG | Freq: Once | ORAL | Status: AC
Start: 2022-01-15 — End: 2022-01-14

## 2022-01-15 MED ORDER — DIPHENHYDRAMINE HCL 25 MG PO CAPS
25 MG | Freq: Once | ORAL | Status: AC | PRN
Start: 2022-01-15 — End: 2022-01-14

## 2022-01-15 MED ORDER — PREDNISONE 20 MG PO TABS
20 MG | ORAL | Status: AC
Start: 2022-01-15 — End: 2022-01-14
  Administered 2022-01-15: 04:00:00 40 via ORAL

## 2022-01-15 MED FILL — PREDNISONE 20 MG PO TABS: 20 mg | ORAL | Qty: 2

## 2022-01-15 MED FILL — DIPHENHYDRAMINE HCL 25 MG PO CAPS: 25 mg | ORAL | Qty: 1
